# Patient Record
Sex: Female | Born: 1975 | Race: White | Hispanic: Yes | Marital: Single | Smoking: Never smoker
Health system: Southern US, Community
[De-identification: ages and names within clinical notes are randomized; demographics above are authoritative.]

## PROBLEM LIST (undated history)

## (undated) DIAGNOSIS — I8390 Asymptomatic varicose veins of unspecified lower extremity: Secondary | ICD-10-CM

## (undated) HISTORY — PX: CHOLECYSTECTOMY: SHX55

---

## 2005-03-03 ENCOUNTER — Observation Stay: Payer: Self-pay

## 2007-10-15 ENCOUNTER — Ambulatory Visit: Payer: Self-pay | Admitting: Family Medicine

## 2012-07-09 ENCOUNTER — Ambulatory Visit: Payer: Self-pay | Admitting: Primary Care

## 2014-02-21 ENCOUNTER — Emergency Department: Payer: Self-pay | Admitting: Emergency Medicine

## 2014-02-21 LAB — URINALYSIS, COMPLETE
BILIRUBIN, UR: NEGATIVE
Bacteria: NONE SEEN
GLUCOSE, UR: NEGATIVE mg/dL (ref 0–75)
KETONE: NEGATIVE
Leukocyte Esterase: NEGATIVE
Nitrite: NEGATIVE
Ph: 6 (ref 4.5–8.0)
Protein: NEGATIVE
RBC,UR: 3 /HPF (ref 0–5)
Specific Gravity: 1.012 (ref 1.003–1.030)
WBC UR: 1 /HPF (ref 0–5)

## 2014-02-21 LAB — CBC WITH DIFFERENTIAL/PLATELET
BASOS ABS: 0.1 10*3/uL (ref 0.0–0.1)
BASOS PCT: 0.7 %
Eosinophil #: 0.2 10*3/uL (ref 0.0–0.7)
Eosinophil %: 2 %
HCT: 37.7 % (ref 35.0–47.0)
HGB: 12.9 g/dL (ref 12.0–16.0)
LYMPHS ABS: 4.4 10*3/uL — AB (ref 1.0–3.6)
Lymphocyte %: 35.2 %
MCH: 32.5 pg (ref 26.0–34.0)
MCHC: 34.1 g/dL (ref 32.0–36.0)
MCV: 95 fL (ref 80–100)
MONOS PCT: 5.8 %
Monocyte #: 0.7 x10 3/mm (ref 0.2–0.9)
NEUTROS ABS: 7 10*3/uL — AB (ref 1.4–6.5)
NEUTROS PCT: 56.3 %
Platelet: 266 10*3/uL (ref 150–440)
RBC: 3.97 10*6/uL (ref 3.80–5.20)
RDW: 12.8 % (ref 11.5–14.5)
WBC: 12.5 10*3/uL — ABNORMAL HIGH (ref 3.6–11.0)

## 2014-02-21 LAB — COMPREHENSIVE METABOLIC PANEL
ALBUMIN: 4.6 g/dL (ref 3.4–5.0)
ALK PHOS: 83 U/L
AST: 35 U/L (ref 15–37)
Anion Gap: 5 — ABNORMAL LOW (ref 7–16)
BILIRUBIN TOTAL: 0.2 mg/dL (ref 0.2–1.0)
BUN: 16 mg/dL (ref 7–18)
CHLORIDE: 107 mmol/L (ref 98–107)
CO2: 26 mmol/L (ref 21–32)
CREATININE: 0.64 mg/dL (ref 0.60–1.30)
Calcium, Total: 8.9 mg/dL (ref 8.5–10.1)
EGFR (Non-African Amer.): >60
GLUCOSE: 89 mg/dL (ref 65–99)
Osmolality: 276 (ref 275–301)
POTASSIUM: 3.4 mmol/L — AB (ref 3.5–5.1)
SGPT (ALT): 40 U/L (ref 12–78)
Sodium: 138 mmol/L (ref 136–145)
Total Protein: 8.3 g/dL — ABNORMAL HIGH (ref 6.4–8.2)

## 2014-02-22 LAB — GC/CHLAMYDIA PROBE AMP

## 2014-02-22 LAB — WET PREP, GENITAL

## 2015-04-30 ENCOUNTER — Encounter: Payer: Self-pay | Admitting: Emergency Medicine

## 2015-04-30 ENCOUNTER — Emergency Department: Payer: Self-pay

## 2015-04-30 DIAGNOSIS — R51 Headache: Secondary | ICD-10-CM | POA: Insufficient documentation

## 2015-04-30 NOTE — ED Notes (Signed)
Pt presents to ER alert and amb. Pt states left side of head pain. Pt reports intermittent pain on left side. No facial droop, clear speech. Ambulatory with steady gait.

## 2015-05-01 ENCOUNTER — Emergency Department
Admission: EM | Admit: 2015-05-01 | Discharge: 2015-05-01 | Disposition: A | Discharge status: Home / Self Care | Payer: Self-pay | Attending: Emergency Medicine | Admitting: Emergency Medicine

## 2015-05-01 DIAGNOSIS — R519 Headache, unspecified: Secondary | ICD-10-CM

## 2015-05-01 DIAGNOSIS — R51 Headache: Secondary | ICD-10-CM

## 2015-05-01 HISTORY — DX: Asymptomatic varicose veins of unspecified lower extremity: I83.90

## 2015-05-01 LAB — SEDIMENTATION RATE: Sed Rate: 16 mm/hr (ref 0–20)

## 2015-05-01 MED ORDER — KETOROLAC TROMETHAMINE 30 MG/ML IJ SOLN
30.0000 mg | Freq: Once | INTRAMUSCULAR | Status: AC
  Administered 2015-05-01: 30 mg via INTRAVENOUS
  Filled 2015-05-01: qty 1

## 2015-05-01 MED ORDER — METOCLOPRAMIDE HCL 5 MG/ML IJ SOLN
10.0000 mg | Freq: Once | INTRAMUSCULAR | Status: AC
  Administered 2015-05-01: 10 mg via INTRAVENOUS
  Filled 2015-05-01: qty 2

## 2015-05-01 NOTE — ED Notes (Signed)
Pt states pain is better 

## 2015-05-01 NOTE — ED Notes (Signed)
Pt has left side temporal headache.  No n/v.  Sx for 2 days.  Pt alert and speech is clear. States pain goes into left side of face.  Intermittent dizziness.

## 2015-05-01 NOTE — Discharge Instructions (Signed)
Dolor de cabeza general sin causa  (General Headache Without Cause)   EL dolor de cabeza es un dolor o malestar que se siente en la zona de la cabeza o del cuello. Puede no tener una causa especfica. Hay muchas causas y tipos de dolores de [place]. Los ms comunes son:   Cefalea tensional.  Cefaleas migraosas.  Cefalea en brotes.  Cefaleas diarias crnicas. INSTRUCCIONES PARA EL CUIDADO EN EL HOGAR   [place]umpla con todas las citas programadas con su mdico o con el especialista al que lo hayan derivado.  Slo tome medicamentos de venta libre o recetados para Glass blower/designer o Health and safety inspector, segn las indicaciones de su mdico.  Cuando sienta dolor de cabeza acustese en un cuarto oscuro y tranquilo.  Lleve un registro diario para Neurosurgeon lo que Engineer, mining. Por ejemplo, escriba:  Lo que come y bebe.  Cunto tiempo duerme.  Todo cambio en la dieta o medicamentos.  Intente con masajes u otras tcnicas de relajacin.  Colquese compresas de hielo o calor en la cabeza y en el cuello. selos 3 a 4 veces por da de 15 a 20 minutos por vez, o como sea necesario.  Limite las situaciones de estrs.  Sintese con la espalda recta y no  tense los msculos.  Si fuma, deje de hacerlo.  Limite el consumo de bebidas alcohlicas.  Consuma menos cantidad de cafena o deje de tomarla.  Coma y duerma en horarios regulares.  Duerma entre 7 y 78 horas o como lo indique su [place] luces tenues si le molestan las luces brillantes y Actor dolor de [place]. SOLICITE ATENCIN MDICA SI:   Tiene problemas con los Haematologist.  Los medicamentos no Teaching laboratory technician.  El dolor de cabeza que senta habitualmente es diferente.  Tiene nuseas o vmitos. SOLICITE ATENCIN MDICA DE INMEDIATO SI:   El dolor se hace cada vez ms intenso.  Tiene fiebre.  Presenta rigidez en el cuello.  Sufre prdida de la visin.  Presenta debilidad muscular o prdida  del control muscular.  Comienza a perder el equilibrio o tiene problemas para caminar.  Sufre mareos o se desmaya.  Tiene sntomas graves que son diferentes a los primeros sntomas. ASEGRESE DE QUE:   Comprende estas instrucciones.  Controlar su enfermedad.  Solicitar ayuda de inmediato si no mejora o empeora. Document Released: 06/29/2005 Document Revised: 03/20/2012 [facility] Patient Information 2015 [place]. This information is not intended to replace advice given to you by your health care provider. Make sure you discuss any questions you have with your health care provider.

## 2015-05-01 NOTE — ED Provider Notes (Signed)
[facility] Emergency Department Provider Note  ____________________________________________  Time seen: 1:15 AM  I have reviewed the triage vital signs and the nursing notes.   HISTORY  Chief Complaint Facial Pain     HPI Katelyn Oneal is a 38 y.o. female presents with left temporal headache that is currently 9 out of 10 on a pain score 2 days. Patient states that the pain is common in by intermittent dizziness. Patient denies any weakness or numbness, no visual changes no gait instability. Patient denies taking any analgesic at home. Patient denies any previous headache of this nature in the past. No history of migraines. Patient denies any fever no neck pain or stiffness.    Past Medical History  Diagnosis Date  . Varicose vein     There are no active problems to display for this patient.   Past Surgical History  Procedure Laterality Date  . Cholecystectomy      No current outpatient prescriptions on file.  Allergies Review of patient's allergies indicates no known allergies.  History reviewed. No pertinent family history.  Social History History  Substance Use Topics  . Smoking status: Never Smoker   . Smokeless tobacco: Not on file  . Alcohol Use: No    Review of Systems  Constitutional: Negative for fever. Eyes: Negative for visual changes. ENT: Negative for sore throat. Cardiovascular: Negative for chest pain. Respiratory: Negative for shortness of breath. Gastrointestinal: Negative for abdominal pain, vomiting and diarrhea. Genitourinary: Negative for dysuria. Musculoskeletal: Negative for back pain. Skin: Negative for rash. Neurological: Positive for headaches   10-point ROS otherwise negative.  ____________________________________________   PHYSICAL EXAM:  VITAL SIGNS: ED Triage Vitals  Enc Vitals Group     BP 04/30/15 2251 135/69 mmHg     Pulse Rate 04/30/15 2251 62     Resp 04/30/15 2251 20     Temp  04/30/15 2251 98.3 F (36.8 C)     Temp Source 04/30/15 2251 Oral     SpO2 04/30/15 2251 100 %     Weight 04/30/15 2251 196 lb (88.905 kg)     Height 04/30/15 2251 5\' 4"  (1.626 m)     Head Cir --      Peak Flow --      Pain Score 04/30/15 2251 10     Pain Loc --      Pain Edu? --      Excl. in [place]? --     Constitutional: Alert and oriented. Well appearing and in no distress. Eyes: Conjunctivae are normal. PERRL. Normal extraocular movements. ENT   Head: Normocephalic and atraumatic.   Nose: No congestion/rhinnorhea.   Mouth/Throat: Mucous membranes are moist.   Neck: No stridor. Cardiovascular: Normal rate, regular rhythm. Normal and symmetric distal pulses are present in all extremities. No murmurs, rubs, or gallops. Respiratory: Normal respiratory effort without tachypnea nor retractions. Breath sounds are clear and equal bilaterally. No wheezes/rales/rhonchi. Gastrointestinal: Soft and nontender. No distention. There is no CVA tenderness. Genitourinary: deferred Musculoskeletal: Nontender with normal range of motion in all extremities. No joint effusions.  No lower extremity tenderness nor edema. Neurologic:  Normal speech and language. No gross focal neurologic deficits are appreciated. Speech is normal.  Skin:  Skin is warm, dry and intact. No rash noted. Psychiatric: Mood and affect are normal. Speech and behavior are normal. Patient exhibits appropriate insight and judgment.      RADIOLOGY  CT scan of the head revealed no acute intracranial abnormality  CT Head Wo Contrast (Final result) Result time: 05/01/15 00:03:46   Final result by Rad Results In Interface (05/01/15 00:03:46)   Narrative:   CLINICAL DATA: Left-sided headache.  EXAM: CT HEAD WITHOUT CONTRAST  TECHNIQUE: Contiguous axial images were obtained from the base of the skull through the vertex without intravenous contrast.  COMPARISON: None.  FINDINGS: No intracranial  hemorrhage, mass effect, or midline shift. No hydrocephalus. The basilar cisterns are patent. No evidence of territorial infarct. No intracranial fluid collection. Calvarium is intact. Included paranasal sinuses and mastoid air cells are well aerated.  IMPRESSION: No acute intracranial abnormality.   Electronically Signed By: Jeb Levering M.D. On: 05/01/2015 00:03       INITIAL IMPRESSION / ASSESSMENT AND PLAN / ED COURSE  Pertinent labs & imaging results that were available during my care of the patient were reviewed by me and considered in my medical decision making (see chart for details).  ----------------------------------------- 1:48 AM on 05/01/2015 -----------------------------------------  Patient received Toradol and Reglan with pain score decreased to 2 out of 10. Dizziness at this time considered other etiologies of the patient's pain mainly meningitis or intracranial mass or hemorrhage given absence of fever or neck pain or stiffness and negative CT scan unlikely to be the etiology of patient's pain. ____________________________________________   FINAL CLINICAL IMPRESSION(S) / ED DIAGNOSES  Final diagnoses:  Acute nonintractable headache, unspecified headache type      Gregor Hams, MD 05/01/15 [phone]

## 2016-08-01 ENCOUNTER — Ambulatory Visit (INDEPENDENT_AMBULATORY_CARE_PROVIDER_SITE_OTHER): Payer: Self-pay | Admitting: Podiatry

## 2016-08-01 ENCOUNTER — Ambulatory Visit (INDEPENDENT_AMBULATORY_CARE_PROVIDER_SITE_OTHER): Payer: Self-pay

## 2016-08-01 VITALS — BP 102/67 | HR 70 | Temp 98.2°F | Resp 16 | Ht 64.0 in | Wt 200.0 lb

## 2016-08-01 DIAGNOSIS — M722 Plantar fascial fibromatosis: Secondary | ICD-10-CM

## 2016-08-01 DIAGNOSIS — R52 Pain, unspecified: Secondary | ICD-10-CM

## 2016-08-01 MED ORDER — MELOXICAM 15 MG PO TABS: 15.0000 mg | ORAL_TABLET | Freq: Every day | ORAL | 3 refills | Status: DC

## 2016-08-01 MED ORDER — METHYLPREDNISOLONE 4 MG PO TBPK: ORAL_TABLET | ORAL | 0 refills | Status: DC

## 2016-08-01 NOTE — Progress Notes (Signed)
   Subjective:    Patient ID: Katelyn Oneal, female    DOB: 1975/12/25, 40 y.o.   MRN: [id number]  HPI: She presents today with a chief complaint of pain to her plantar medial aspect of her right foot. She states has been going on for about 5-6 months now this started while she was pregnant with her youngest son. She states that she's tried icing it which gives him some relief. She states that she is not breast-feeding.  Review of Systems  Musculoskeletal: Positive for gait problem.  All other systems reviewed and are negative.      Objective:   Physical Exam: Vital signs are stable alert and oriented 3. Pulses are palpable. Neurologic sensorium is intact. Deep tendon reflexes are intact. Strength was 5 over 5 dorsiflexion plantar flexors and inverters everters all intrinsic musculature is intact. Orthopedic evaluation of her tall joints distal to the ankle range of motion without crepitation. She has pain on palpation medially a neutral of the right heel. Radiographs taken today do demonstrate soft tissue increase in density of the plantar fascia calcaneal insertion site of the right heel. Cutaneous evaluationof well-hydrated cutis no erythema edema cellulitis drainage or odor.        Assessment & Plan:  Assessment: Plantar fasciitis right.  Plan: I injected her right heel today with Kenalog and local anesthetic started her on a Medrol Dosepak to be followed by meloxicam. Placed her in a plantar fascia brace. Discussed appropriate shoe gear stretching exercises ice therapy and shoe modifications. Will follow up with her in 1 month. Dispensed stretching exercises.

## 2016-08-01 NOTE — Patient Instructions (Signed)
Fascitis plantar con rehabilitación  (Plantar Fasciitis With Rehab)  La fascia plantar es una estructura fibrosa, tipo ligamento, de tejido blando que abarca la parte inferior del pie. La fascitis plantar es una enfermedad que ocasiona dolor en el pie debido a la inflamación del tejido.  SÍNTOMAS  · Dolor y sensibilidad en la planta del pie.  · Dolor especialmente al ponerse de pie o caminar.  CAUSAS  La fascitis plantar está causada por irritación y lesión en la fascia plantar debajo del pie. Los mecanismos más frecuentes de una lesión son:  · Golpe directo en la planta del pie.  · Daño a un pequeño nervio que atraviesa el pie, en el lugar en que la fascia principal se une al hueso del talón.  · Estrés aplicado en la fascia plantar debido a espolones óseos.  EL RIESGO AUMENTA CON:   · Actividades que estresan la fascia plantar (correr, saltar, pivotar o cortar).  · Poca fuerza y flexibilidad.  · Calzado mal ajustado.  · Músculos de la pantorrilla tensos.  · Pie plano.  · No hacer un precalentamiento adecuado.  · Obesidad.  PREVENCIÓN  · Precalentamiento adecuado y elongación antes de la actividad.  · Descanso y recuperación entre actividades.  · Mantener la forma física:    Fuerza, flexibilidad y resistencia muscular.    Capacidad cardiovascular.  · Mantenga un peso corporal adecuado.  · Evite el estrés en la fascia plantar.  · Para deportistas con pie plano, utilización de plantillas anatómicas para los arcos.  PRONÓSTICO  Si se trata adecuadamente, generalmente es curable sin cirugía. En ocasiones requiere someterse a una cirugía.  POSIBLES COMPLICACIONES  · La recurrencia frecuente de los síntomas puede dar como resultado un problema crónico.  · Problemas en la cintura causados para compensar la lesión, como renguera.  · Dolor o debilidad en el pie al adelantar el pie luego de la cirugía.  · Inflamación crónica, cicatrización y ruptura parcial o completa de la fascia, que se produce luego de repetidas  inyecciones.  TRATAMIENTO  El tratamiento inicial incluye el uso de medicamentos y la aplicación de hielo para reducir el dolor y la inflamación. Los ejercicios de elongación y fortalecimiento pueden ayudar a reducir el dolor con la actividad, en especial los dirigidos al tendón de [place]. Los ejercicios pueden realizarse en el hogar o con un terapeuta. El médico le podrá recomendar que utilice tacos o soportes para el arco para ayudar a reducir el estrés de la fascia plantar. En algunos casos se indica una inyección de corticoides para reducir la inflamación. Si los síntomas persisten por más de 6 meses de tratamiento no quirúrgico (conservador), se indicará la cirugía.   MEDICAMENTOS  · Si necesita analgésicos, se recomiendan los antiinflamatorios no esteroides, como aspirina e ibuprofeno y otros calmantes menores, como acetaminofeno  · No tome medicamentos para el dolor dentro de los 7 días previos a la cirugía.  · Los analgésicos prescriptos se indicarán si el médico lo considera necesario. Utilícelos como se le indique y sólo cuando lo necesite.  · En algunos casos se indica una inyección de corticosteroides. Estas inyecciones deben reservarse para los casos graves, porque sólo se pueden administrar una determinada cantidad de veces.  CALOR Y FRÍO  · El tratamiento con frío alivia el dolor y reduce la inflamación. El frío debe aplicarse durante 10 a 15 minutos cada 2 ó 3 horas para reducir la inflamación y el dolor e inmediatamente después de cualquier actividad que agrava los síntomas.   Utilice bolsas de hielo o masajee la zona con un trozo de hielo (masaje de hielo).  · El calor puede usarse antes de elongar y de las actividades de fortalecimiento indicadas por el profesional, le fisioterapeuta o el entrenador. Utilice una bolsa térmica o sumerja la lesión en agua caliente.  SOLICITE ATENCIÓN MÉDICA DE INMEDIATO SI:  · El tratamiento no lo beneficia, o el trastorno empeora.  · Los medicamentos producen  efectos secundarios.  EJERCICIOS  EJERCICIOS DE AMPLITUD DE MOVIMIENTOS Y ELONGACIÓN - Fascitis plantar (síndrome del espolón en el talón)  Estos ejercicios le ayudarán en la recuperación de la lesión. Los síntomas podrán aliviarse con o sin asistencia adicional de su médico, fisioterapeuta o entrenador. Al completar estos ejercicios, recuerde:   · Restaurar la flexibilidad del tejido ayuda a que las articulaciones recuperen el movimiento normal. Esto permite que el movimiento y la actividad sea más saludables y menos dolorosos.  · Para que sea efectiva, cada elongación debe realizarse durante al menos 30 segundos.  · La elongación nunca debe ser dolorosa. Deberá sentir sólo un alargamiento o distensión suave del tejido que estira.  AMPLITUD DE MOVIMIENTOS - Extensión de los dedos - flexión  · Siéntese con la pierna derecha / izquierdo cruzada sobre la rodilla opuesta.  · Tome los dedos de los pies y empújelos hacia usted. Debe sentir un estiramiento suave en la zona inferior de los dedos y del pie.  · Mantenga esta posición durante __________ segundos.  · Luego, tome los dedos de los pies y empújelos hacia abajo. Debe sentir un estiramiento suave en la zona superior de los dedos y del pie.  · Mantenga esta posición durante __________ segundos.  Repítalo __________ veces. Realice este estiramiento __________ veces por día.   AMPLITUD DE MOVIMIENTOS - Dorsiflexión del tobillo - activa, asistida  · Quítese los zapatos y siéntese en una silla, preferiblemente en una superficie sin alfombra.  · Coloque el pie derecha / izquierdo debajo de la rodilla. Extienda la pierna contraria para estar apoyado.  · Con el talón hacia abajo, deslice el pie derecha / izquierdo hacia la silla hasta que sienta un estiramiento en el tobillo o pantorrilla. Si no lo siente, deslice la cadera hacia adelante hasta el borde de la silla, manteniendo el talón hacia abajo.  · Mantenga esta posición durante __________ segundos.  Repítalo  __________ veces. Realice este estiramiento __________ veces por día.   ELONGACIÓN - Gastroc De pie  · Coloque las manos en la pared.  · Extienda la pierna derecha / izquierdo y mantenga la rodilla levemente flexionada.  · Apunte los dedos ligeramente hacia adentro con el pie de atrás.  · Mantenga el talón derecha / izquierdo en el suelo y la rodilla recta, cambie el peso hacia la pared y no permita que la espalda se arquee.  · Debe sentir un estiramiento en la pantorrila derecha / izquierdo. Mantenga esta posicición durante __________ segundos.  Repítalo __________ veces. Realice este estiramiento __________ veces por día.  ELONGACIÓN - Sóleo De pie  · Coloque las manos en la pared.  · Extienda la pierna derecha / izquierdo y mantenga la rodilla levemente flexionada.  · Apunte los dedos ligeramente hacia adentro con el pie de atrás.  · Mantenga el talón derecha / izquierdo en el suelo, doble la rodilla de atrás y cambie suavemente el peso sobre la pierna de atrás hasta que sienta un ligero estiramiento en la pantorrilla de atrás.  · Mantenga esta posicición durante __________ segundos.    Repítalo __________ veces. Realice este estiramiento __________ veces por día.  ELONGACIÓN - Gastrocsoleus De pie  Nota: Este ejercicio puede ser muy estresante para el pie y el tobillo. Realice los ejercicios sólo en la forma indicada por el profesional que lo asiste.   · Coloque la región metatarsiana del pie derecha / izquierdo y el otro en el mismo escalón.  · Si es necesario, sosténgase de la pared o de la barandilla de una escalera para mantener el equilibrio.  · Levante lentamente el otro pie, y permita que el peso del cuerpo presione el talón sobre el borde del escalón.  · Debe sentir un estiramiento en la pantorrilla derecha / izquierdo.  · Mantenga esta posicición durante __________ segundos.  · Repita este ejercicio con la rodilla derecha / izquierdo levemente flexionada.  Repítalo __________ veces. Realice este  estiramiento __________ veces por día.   EJERCICIOS DE FORTALECIMIENTO - Fascitis plantar (síndrome del espolón en el talón)  Estos ejercicios le ayudarán en la recuperación de la lesión. Los síntomas podrán desaparecer con o sin mayor intervención del profesional, el fisioterapeuta o el entrenador. Al completar estos ejercicios, recuerde:   · Los músculos pueden ganar tanto la resistencia como la fortaleza que necesita para sus actividades diarias a través de ejercicios controlados.  · Realice los ejercicios como se lo indicó el médico, el fisioterapeuta o el entrenador. Aumente la resistencia y las repeticiones según se le haya indicado.  FUERZA - Rollo de toalla  · Siéntese en una silla en una superficie no alfombrada.  · Coloque el pie en una toalla, y mantenga el talón en el suelo.  · Coloque la toalla alrededor del talón pero envuelva sólo los dedos del pie. Mantenga el talón contra el piso.  · Añada ____________________ al extremo de la toalla si el médico, fisioterapeuta o entrenador se lo indican.  Repítalo __________ veces. Realice este ejercicio __________ veces por día.  FUERZA - Inversión del tobillo  · Asegure un extremo de una banda de goma para ejercicios a un objeto fijo (mesa, columna). Ate el extremo opuesto a su pie, justo antes de los dedos.  · Coloque los puños entre las rodillas. Esto hará que la fuerza se concentre en el tobillo.  · Lentamente, tire del dedo gordo hacia arriba y hacia adentro y asegúrese de que la banda está posicionada de tal forma que puede resistir el movimiento completo.  · Mantenga esta posicición durante __________ segundos.  · Haga que los músculos resistan la banda mientras tira lentamente el pie hacia atrás hasta la posición inicial.  Repítalo __________ veces. Realice este ejercicio __________ veces por día.      Esta información no tiene como fin reemplazar el consejo del médico. Asegúrese de hacerle al médico cualquier pregunta que tenga.     Document Released:  07/06/2006 Document Revised: 02/03/2015  [employer] Interactive Patient Education ©2016 [employer]

## 2016-08-29 ENCOUNTER — Ambulatory Visit: Payer: Medicaid Other | Admitting: Podiatry

## 2016-09-05 ENCOUNTER — Ambulatory Visit: Payer: Self-pay | Admitting: Podiatry

## 2017-12-20 ENCOUNTER — Emergency Department: Payer: Self-pay

## 2017-12-20 ENCOUNTER — Other Ambulatory Visit: Payer: Self-pay

## 2017-12-20 ENCOUNTER — Emergency Department
Admission: EM | Admit: 2017-12-20 | Discharge: 2017-12-20 | Disposition: A | Discharge status: Home / Self Care | Payer: Self-pay | Attending: Student in an Organized Health Care Education/Training Program | Admitting: Student in an Organized Health Care Education/Training Program

## 2017-12-20 DIAGNOSIS — R300 Dysuria: Secondary | ICD-10-CM | POA: Insufficient documentation

## 2017-12-20 DIAGNOSIS — Z79899 Other long term (current) drug therapy: Secondary | ICD-10-CM | POA: Insufficient documentation

## 2017-12-20 DIAGNOSIS — R102 Pelvic and perineal pain: Secondary | ICD-10-CM | POA: Insufficient documentation

## 2017-12-20 LAB — URINALYSIS, COMPLETE (UACMP) WITH MICROSCOPIC
Bilirubin Urine: NEGATIVE
GLUCOSE, UA: NEGATIVE mg/dL
Ketones, ur: NEGATIVE mg/dL
Nitrite: NEGATIVE
PH: 6 (ref 5.0–8.0)
Protein, ur: NEGATIVE mg/dL
SPECIFIC GRAVITY, URINE: 1.005 (ref 1.005–1.030)

## 2017-12-20 LAB — LIPASE, BLOOD: Lipase: 31 U/L (ref 11–51)

## 2017-12-20 LAB — COMPREHENSIVE METABOLIC PANEL
ALT: 37 U/L (ref 14–54)
AST: 27 U/L (ref 15–41)
Albumin: 4.7 g/dL (ref 3.5–5.0)
Alkaline Phosphatase: 77 U/L (ref 38–126)
Anion gap: 9 (ref 5–15)
BUN: 15 mg/dL (ref 6–20)
CHLORIDE: 104 mmol/L (ref 101–111)
CO2: 25 mmol/L (ref 22–32)
CREATININE: 0.66 mg/dL (ref 0.44–1.00)
Calcium: 9.2 mg/dL (ref 8.9–10.3)
GFR calc non Af Amer: >60 mL/min (ref >60)
Glucose, Bld: 87 mg/dL (ref 65–99)
POTASSIUM: 3.4 mmol/L — AB (ref 3.5–5.1)
SODIUM: 138 mmol/L (ref 135–145)
Total Bilirubin: 0.5 mg/dL (ref 0.3–1.2)
Total Protein: 8.6 g/dL — ABNORMAL HIGH (ref 6.5–8.1)

## 2017-12-20 LAB — CBC
HCT: 41.3 % (ref 35.0–47.0)
HEMOGLOBIN: 13.7 g/dL (ref 12.0–16.0)
MCH: 31.2 pg (ref 26.0–34.0)
MCHC: 33 g/dL (ref 32.0–36.0)
MCV: 94.5 fL (ref 80.0–100.0)
Platelets: 311 10*3/uL (ref 150–440)
RBC: 4.37 MIL/uL (ref 3.80–5.20)
RDW: 13.4 % (ref 11.5–14.5)
WBC: 11.7 10*3/uL — ABNORMAL HIGH (ref 3.6–11.0)

## 2017-12-20 LAB — POCT PREGNANCY, URINE: Preg Test, Ur: NEGATIVE

## 2017-12-20 MED ORDER — NITROFURANTOIN MONOHYD MACRO 100 MG PO CAPS: 100.0000 mg | ORAL_CAPSULE | Freq: Two times a day (BID) | ORAL | 0 refills | Status: AC

## 2017-12-20 MED ORDER — TRAMADOL HCL 50 MG PO TABS
50.0000 mg | ORAL_TABLET | Freq: Once | ORAL | Status: AC
  Administered 2017-12-20: 50 mg via ORAL

## 2017-12-20 MED ORDER — TRAMADOL HCL 50 MG PO TABS: 50.0000 mg | ORAL_TABLET | Freq: Four times a day (QID) | ORAL | 0 refills | Status: AC | PRN

## 2017-12-20 MED ORDER — TRAMADOL HCL 50 MG PO TABS
ORAL_TABLET | ORAL | Status: AC
  Filled 2017-12-20: qty 1

## 2017-12-20 NOTE — ED Provider Notes (Signed)
[facility] Emergency Department Provider Note    None    (approximate)  I have reviewed the triage vital signs and the nursing notes.   HISTORY  Chief Complaint Abdominal Pain    HPI Katelyn Oneal is a 42 y.o. female was no significant past medical history presents with chief complaint of left sided lower abdominal pain initially started on Monday with foul-smelling urine, darker urine and dysuria.  The symptoms somewhat improved but is still having left lower quadrant primarily pelvic pain.  Denies any known history of cysts or fibroids.  States the pain comes and goes and intermittently severe in nature.  Denies any fevers.  No diarrhea or constipation.  No blood in her stools.  States that she did have some slight vaginal spotting yesterday but none today.  Past Medical History:  Diagnosis Date  . Varicose vein    No family history on file. Past Surgical History:  Procedure Laterality Date  . CHOLECYSTECTOMY     There are no active problems to display for this patient.     Prior to Admission medications   Medication Sig Start Date End Date Taking? Authorizing Provider  diclofenac (VOLTAREN) 75 MG EC tablet Take 75 mg by mouth 2 (two) times daily.    [provider]  meloxicam (MOBIC) 15 MG tablet Take 1 tablet (15 mg total) by mouth daily. 08/01/16   Hyatt, Max T, DPM  methylPREDNISolone (MEDROL DOSEPAK) 4 MG TBPK tablet 6 day dose pack - take as directed 08/01/16   Hyatt, Max T, DPM  nitrofurantoin, macrocrystal-monohydrate, (MACROBID) 100 MG capsule Take 1 capsule (100 mg total) by mouth 2 (two) times daily for 3 days. 12/20/17 12/23/17  Merlyn Lot, MD  traMADol (ULTRAM) 50 MG tablet Take 1 tablet (50 mg total) by mouth every 6 (six) hours as needed. 12/20/17 12/20/18  Merlyn Lot, MD    Allergies Patient has no known allergies.    Social History Social History   Tobacco Use  . Smoking status: Never Smoker    Substance Use Topics  . Alcohol use: No  . Drug use: Not on file    Review of Systems Patient denies headaches, rhinorrhea, blurry vision, numbness, shortness of breath, chest pain, edema, cough, abdominal pain, nausea, vomiting, diarrhea, dysuria, fevers, rashes or hallucinations unless otherwise stated above in HPI. ____________________________________________   PHYSICAL EXAM:  VITAL SIGNS: Vitals:   12/20/17 1638 12/20/17 2025  BP: (!) 143/75 121/85  Pulse: 72 72  Resp: 18 17  Temp: 98.7 F (37.1 C)   SpO2: 99% 100%    Constitutional: Alert and oriented. Well appearing and in no acute distress. Eyes: Conjunctivae are normal.  Head: Atraumatic. Nose: No congestion/rhinnorhea. Mouth/Throat: Mucous membranes are moist.   Neck: No stridor. Painless ROM.  Cardiovascular: Normal rate, regular rhythm. Grossly normal heart sounds.  Good peripheral circulation. Respiratory: Normal respiratory effort.  No retractions. Lungs CTAB. Gastrointestinal: Soft and nontender in all four quadrants. No distention. No abdominal bruits. No CVA tenderness. Genitourinary:  Musculoskeletal: No lower extremity tenderness nor edema.  No joint effusions. Neurologic:  Normal speech and language. No gross focal neurologic deficits are appreciated. No facial droop Skin:  Skin is warm, dry and intact. No rash noted. Psychiatric: Mood and affect are normal. Speech and behavior are normal.  ____________________________________________   LABS (all labs ordered are listed, but only abnormal results are displayed)  Results for orders placed or performed during the hospital encounter of 12/20/17 (from the  past 24 hour(s))  Lipase, blood     Status: None   Collection Time: 12/20/17  4:42 PM  Result Value Ref Range   Lipase 31 11 - 51 U/L  Comprehensive metabolic panel     Status: Abnormal   Collection Time: 12/20/17  4:42 PM  Result Value Ref Range   Sodium 138 135 - 145 mmol/L   Potassium 3.4 (L)  3.5 - 5.1 mmol/L   Chloride 104 101 - 111 mmol/L   CO2 25 22 - 32 mmol/L   Glucose, Bld 87 65 - 99 mg/dL   BUN 15 6 - 20 mg/dL   Creatinine, Ser 0.66 0.44 - 1.00 mg/dL   Calcium 9.2 8.9 - 10.3 mg/dL   Total Protein 8.6 (H) 6.5 - 8.1 g/dL   Albumin 4.7 3.5 - 5.0 g/dL   AST 27 15 - 41 U/L   ALT 37 14 - 54 U/L   Alkaline Phosphatase 77 38 - 126 U/L   Total Bilirubin 0.5 0.3 - 1.2 mg/dL   GFR calc non Af Amer >60 >60 mL/min   GFR calc Af Amer >60 >60 mL/min   Anion gap 9 5 - 15  CBC     Status: Abnormal   Collection Time: 12/20/17  4:42 PM  Result Value Ref Range   WBC 11.7 (H) 3.6 - 11.0 K/uL   RBC 4.37 3.80 - 5.20 MIL/uL   Hemoglobin 13.7 12.0 - 16.0 g/dL   HCT 41.3 35.0 - 47.0 %   MCV 94.5 80.0 - 100.0 fL   MCH 31.2 26.0 - 34.0 pg   MCHC 33.0 32.0 - 36.0 g/dL   RDW 13.4 11.5 - 14.5 %   Platelets 311 150 - 440 K/uL  Urinalysis, Complete w Microscopic     Status: Abnormal   Collection Time: 12/20/17  4:42 PM  Result Value Ref Range   Color, Urine STRAW (A) YELLOW   APPearance CLEAR (A) CLEAR   Specific Gravity, Urine 1.005 1.005 - 1.030   pH 6.0 5.0 - 8.0   Glucose, UA NEGATIVE NEGATIVE mg/dL   Hgb urine dipstick MODERATE (A) NEGATIVE   Bilirubin Urine NEGATIVE NEGATIVE   Ketones, ur NEGATIVE NEGATIVE mg/dL   Protein, ur NEGATIVE NEGATIVE mg/dL   Nitrite NEGATIVE NEGATIVE   Leukocytes, UA TRACE (A) NEGATIVE   RBC / HPF 0-5 0 - 5 RBC/hpf   WBC, UA 0-5 0 - 5 WBC/hpf   Bacteria, UA RARE (A) NONE SEEN   Squamous Epithelial / LPF 0-5 (A) NONE SEEN  Pregnancy, urine POC     Status: None   Collection Time: 12/20/17  4:43 PM  Result Value Ref Range   Preg Test, Ur NEGATIVE NEGATIVE   ____________________________________________  EKG____________________________________________  RADIOLOGY  I personally reviewed all radiographic images ordered to evaluate for the above acute complaints and reviewed radiology reports and findings.  These findings were personally discussed  with the patient.  Please see medical record for radiology report.  ____________________________________________   PROCEDURES  Procedure(s) performed:  Procedures    Critical Care performed: no ____________________________________________   INITIAL IMPRESSION / ASSESSMENT AND PLAN / ED COURSE  Pertinent labs & imaging results that were available during my care of the patient were reviewed by me and considered in my medical decision making (see chart for details).  DDX: Enteritis, colitis, cystitis, torsion, cyst, ectopic, colitis, hernia  Katelyn Oneal is a 42 y.o. who presents to the ED with symptoms as described above.  Patient well-appearing.  Able to ambulate and with no unsteady gait.  Patient is not pregnant.  Urinalysis does show trace hemoglobin as well as rare bacteria given her dysuria would have suspicion for UTI but given her left-sided pelvic pain will order ultrasound to exclude torsion or cyst.  Do not feel that CT imaging clinically indicated as the remainder of her abdominal exam is soft and benign.  Possible kidney stone as the patient does not have any flank pain and appears well.  Clinical Course as of Dec 20 2100  Wed Dec 20, 2017  2028 Ultrasound reviewed with patient at bedside with interpreter.  Still having some discomfort but tolerating oral pain medication.  Will give antibiotics for component of cystitis given her dysuria with rare bacteria.  Possible kidney stone but no evidence of AK I or systemic illness.  She is not having any right-sided abdominal pain.  No evidence of hernia.  Possible cyst related pain.  Patient otherwise stable and appropriate for outpatient follow-up.   [PR]    Clinical Course User Index [PR] Merlyn Lot, MD     As part of my medical decision making, I reviewed the following data within the [place] notes reviewed and incorporated, Labs reviewed, notes from prior ED  visits.   ____________________________________________   FINAL CLINICAL IMPRESSION(S) / ED DIAGNOSES  Final diagnoses:  Pelvic pain  Dysuria      NEW MEDICATIONS STARTED DURING THIS VISIT:  Discharge Medication List as of 12/20/2017  8:27 PM    START taking these medications   Details  nitrofurantoin, macrocrystal-monohydrate, (MACROBID) 100 MG capsule Take 1 capsule (100 mg total) by mouth 2 (two) times daily for 3 days., Starting Wed 12/20/2017, Until Sat 12/23/2017, Print    traMADol (ULTRAM) 50 MG tablet Take 1 tablet (50 mg total) by mouth every 6 (six) hours as needed., Starting Wed 12/20/2017, Until Thu 12/20/2018, Print         Note:  This document was prepared using Dragon voice recognition software and may include unintentional dictation errors.    Merlyn Lot, MD 12/20/17 2102

## 2017-12-20 NOTE — Discharge Instructions (Signed)
CLINICAL DATA:  Pelvic pain centered in the left lower quadrant for 1 day.   EXAM: TRANSABDOMINAL AND TRANSVAGINAL ULTRASOUND OF PELVIS   DOPPLER ULTRASOUND OF OVARIES   TECHNIQUE: Both transabdominal and transvaginal ultrasound examinations of the pelvis were performed. Transabdominal technique was performed for global imaging of the pelvis including uterus, ovaries, adnexal regions, and pelvic cul-de-sac.   It was necessary to proceed with endovaginal exam following the transabdominal exam to visualize the adnexa. Color and duplex Doppler ultrasound was utilized to evaluate blood flow to the ovaries.   COMPARISON:  None.   FINDINGS: Uterus   Measurements: 7.2 x 3.8 x 3.8 cm. No fibroids or other mass visualized.   Endometrium   Thickness: 0.7 cm. No focal abnormality visualized. IUD is in place.   Right ovary   Measurements: 5.5 x 3.3 x 5.3 cm. A cyst measuring 4.9 x 2.5 x 5.0 cm is simple in appearance without mural nodule or septation. No debris is seen within the cyst. There is no internal flow within the cyst on Doppler imaging and the cyst has smooth, thin walls.   Left ovary   Measurements: 6.5 x 2.9 x 4.4 cm. A cyst measuring 4.9 x 3.3 x 4.5 cm is simple in appearance without mural nodule or septation. There is no debris within the cyst. There is no internal flow within the cyst on Doppler imaging and the cyst has smooth, thin walls.   Pulsed Doppler evaluation of both ovaries demonstrates normal low-resistance arterial and venous waveforms.   Other findings   No abnormal free fluid.   IMPRESSION: Negative for ovarian torsion.  No acute abnormality.   Simple bilateral ovarian cysts each measure 4.9 cm in diameter. This is almost certainly benign, and no specific imaging follow up is recommended according to the Society of Radiologists in Ultrasound 2010 Consensus Conference Statement (D Clovis Riley et al. Management of Asymptomatic Ovarian and Other  Adnexal Cysts Imaged at [place]: Society of Radiologists in [place] Statement 2010. Radiology 256 (Sept 2010): 916-945.).     Electronically Signed   By: Inge Rise M.D.   On: 12/20/2017 20:03

## 2017-12-20 NOTE — ED Notes (Signed)
Patient transported to X-ray 

## 2017-12-20 NOTE — ED Triage Notes (Signed)
Pt has left lower abd pain with dysuria. States vaginal spotting yesterday.  Pt alert.  Pt denies back pain.

## 2018-01-19 ENCOUNTER — Other Ambulatory Visit: Payer: Self-pay

## 2018-01-19 ENCOUNTER — Emergency Department: Payer: Self-pay

## 2018-01-19 ENCOUNTER — Emergency Department
Admission: EM | Admit: 2018-01-19 | Discharge: 2018-01-19 | Disposition: A | Discharge status: Home / Self Care | Payer: Self-pay | Attending: Emergency Medicine | Admitting: Emergency Medicine

## 2018-01-19 ENCOUNTER — Encounter: Payer: Self-pay | Admitting: Emergency Medicine

## 2018-01-19 DIAGNOSIS — R05 Cough: Secondary | ICD-10-CM | POA: Insufficient documentation

## 2018-01-19 DIAGNOSIS — D1721 Benign lipomatous neoplasm of skin and subcutaneous tissue of right arm: Secondary | ICD-10-CM | POA: Insufficient documentation

## 2018-01-19 DIAGNOSIS — R0789 Other chest pain: Secondary | ICD-10-CM | POA: Insufficient documentation

## 2018-01-19 LAB — TROPONIN I

## 2018-01-19 LAB — CBC
HCT: 37 % (ref 35.0–47.0)
Hemoglobin: 12.9 g/dL (ref 12.0–16.0)
MCH: 32.4 pg (ref 26.0–34.0)
MCHC: 34.8 g/dL (ref 32.0–36.0)
MCV: 93.2 fL (ref 80.0–100.0)
PLATELETS: 303 10*3/uL (ref 150–440)
RBC: 3.97 MIL/uL (ref 3.80–5.20)
RDW: 12.9 % (ref 11.5–14.5)
WBC: 10.3 10*3/uL (ref 3.6–11.0)

## 2018-01-19 LAB — BASIC METABOLIC PANEL
Anion gap: 7 (ref 5–15)
BUN: 12 mg/dL (ref 6–20)
CALCIUM: 9 mg/dL (ref 8.9–10.3)
CO2: 25 mmol/L (ref 22–32)
CREATININE: 0.63 mg/dL (ref 0.44–1.00)
Chloride: 105 mmol/L (ref 101–111)
GFR calc Af Amer: >60 mL/min (ref >60)
GLUCOSE: 114 mg/dL — AB (ref 65–99)
Potassium: 3.4 mmol/L — ABNORMAL LOW (ref 3.5–5.1)
SODIUM: 137 mmol/L (ref 135–145)

## 2018-01-19 MED ORDER — IBUPROFEN 800 MG PO TABS
800.0000 mg | ORAL_TABLET | Freq: Once | ORAL | Status: AC
  Administered 2018-01-19: 800 mg via ORAL

## 2018-01-19 MED ORDER — IBUPROFEN 600 MG PO TABS: 600.0000 mg | ORAL_TABLET | Freq: Three times a day (TID) | ORAL | 0 refills | Status: DC | PRN

## 2018-01-19 MED ORDER — IPRATROPIUM-ALBUTEROL 0.5-2.5 (3) MG/3ML IN SOLN
3.0000 mL | Freq: Once | RESPIRATORY_TRACT | Status: AC
  Administered 2018-01-19: 3 mL via RESPIRATORY_TRACT
  Filled 2018-01-19: qty 3

## 2018-01-19 MED ORDER — ALBUTEROL SULFATE HFA 108 (90 BASE) MCG/ACT IN AERS: 2.0000 | INHALATION_SPRAY | Freq: Four times a day (QID) | RESPIRATORY_TRACT | 0 refills | Status: DC | PRN

## 2018-01-19 MED ORDER — IBUPROFEN 400 MG PO TABS
600.0000 mg | ORAL_TABLET | Freq: Once | ORAL | Status: DC
  Filled 2018-01-19: qty 2

## 2018-01-19 NOTE — ED Notes (Signed)
ED Provider at bedside. 

## 2018-01-19 NOTE — ED Triage Notes (Signed)
C/O left chest pain x 1 month.  States initially pain only bothered patient when she was walking.  States pain has become constant for the last 2-3 days.

## 2018-01-19 NOTE — ED Provider Notes (Signed)
[facility] Emergency Department Provider Note  ____________________________________________   First MD Initiated Contact with Patient 01/19/18 1855     (approximate)  I have reviewed the triage vital signs and the nursing notes.   HISTORY  Chief Complaint Chest Pain    HPI Katelyn Oneal is a 42 y.o. female who comes to the emergency department with 1 month of intermittent atypical left-sided chest pain.  Nonexertional.  No shortness of breath.  Mild cough.  Able to lie flat.  No leg swelling.  Not taking oral contraceptives.  No recent surgery travel or immobilization.  No history of malignancy.  The symptoms have been somewhat worse for the past 2 to 3 days which prompted the visit.  No recent illness.  Her chest pain is sharp throbbing aching left lateral chest nonradiating.  Past Medical History:  Diagnosis Date  . Varicose vein     There are no active problems to display for this patient.   Past Surgical History:  Procedure Laterality Date  . CHOLECYSTECTOMY      Prior to Admission medications   Medication Sig Start Date End Date Taking? Authorizing Provider  albuterol (PROVENTIL HFA;VENTOLIN HFA) 108 (90 Base) MCG/ACT inhaler Inhale 2 puffs into the lungs every 6 (six) hours as needed for wheezing or shortness of breath. 01/19/18   Darel Hong, MD  ibuprofen (ADVIL,MOTRIN) 600 MG tablet Take 1 tablet (600 mg total) by mouth every 8 (eight) hours as needed. 01/19/18   Darel Hong, MD  methylPREDNISolone (MEDROL DOSEPAK) 4 MG TBPK tablet 6 day dose pack - take as directed 08/01/16   Hyatt, Max T, DPM  traMADol (ULTRAM) 50 MG tablet Take 1 tablet (50 mg total) by mouth every 6 (six) hours as needed. 12/20/17 12/20/18  Merlyn Lot, MD    Allergies Patient has no known allergies.  No family history on file.  Social History Social History   Tobacco Use  . Smoking status: Never Smoker  . Smokeless tobacco: Never Used    Substance Use Topics  . Alcohol use: No  . Drug use: Not on file    Review of Systems Constitutional: No fever/chills Eyes: No visual changes. ENT: No sore throat. Cardiovascular: Positive for chest pain. Respiratory: Denies shortness of breath. Gastrointestinal: No abdominal pain.  No nausea, no vomiting.  No diarrhea.  No constipation. Genitourinary: Negative for dysuria. Musculoskeletal: Negative for back pain. Skin: Negative for rash. Neurological: Negative for headaches, focal weakness or numbness.   ____________________________________________   PHYSICAL EXAM:  VITAL SIGNS: ED Triage Vitals [01/19/18 1737]  Enc Vitals Group     BP 132/66     Pulse Rate 80     Resp 16     Temp 97.7 F (36.5 C)     Temp Source Oral     SpO2 98 %     Weight      Height      Head Circumference      Peak Flow      Pain Score      Pain Loc      Pain Edu?      Excl. in [place]?     Constitutional: Alert and oriented x4 pleasant cooperative speaks in complete sentences no diaphoresis Eyes: PERRL EOMI. Head: Atraumatic. Nose: No congestion/rhinnorhea. Mouth/Throat: No trismus Neck: No stridor.   Cardiovascular: Normal rate, regular rhythm. Grossly normal heart sounds.  Good peripheral circulation. Respiratory: Normal respiratory effort.  No retractions. Lungs CTAB and moving good air  Gastrointestinal: Soft nontender Musculoskeletal: No lower extremity edema legs are equal in size neurologic:  Normal speech and language. No gross focal neurologic deficits are appreciated. Skin:  Skin is warm, dry and intact. No rash noted. Psychiatric: Mood and affect are normal. Speech and behavior are normal.    ____________________________________________   DIFFERENTIAL includes but not limited to  Acute coronary syndrome, pericarditis, myocarditis, pulmonary embolism, bronchitis ____________________________________________   LABS (all labs ordered are listed, but only abnormal results  are displayed)  Labs Reviewed  BASIC METABOLIC PANEL - Abnormal; Notable for the following components:      Result Value   Potassium 3.4 (*)    Glucose, Bld 114 (*)    All other components within normal limits  CBC  TROPONIN I    Lab work reviewed by me with no acute disease __________________________________________  EKG  ED ECG REPORT I, Darel Hong, the attending physician, personally viewed and interpreted this ECG.  Date: 01/19/2018 EKG Time:  Rate: 79 Rhythm: normal sinus rhythm QRS Axis: Leftward axis Intervals: normal ST/T Wave abnormalities: normal Narrative Interpretation: no evidence of acute ischemia  ____________________________________________  RADIOLOGY  Chest x-ray reviewed by me concerning for bronchitis ____________________________________________   PROCEDURES  Procedure(s) performed: no  Procedures  Critical Care performed: no  Observation: no ____________________________________________   INITIAL IMPRESSION / ASSESSMENT AND PLAN / ED COURSE  Pertinent labs & imaging results that were available during my care of the patient were reviewed by me and considered in my medical decision making (see chart for details).  The patient comes to the emergency department with 1 month of atypical chest pain.  He come acutely worse for the past several days.  EKG is nonischemic.  Chest x-ray suggestive of chronic bronchitis.  Breathing treatment pending and I will reevaluate.     ----------------------------------------- 8:23 PM on 01/19/2018 -----------------------------------------  After albuterol and ibuprofen the patient feels somewhat improved.  I explained to the patient the diagnostic uncertainty, however I felt she was stable for outpatient management.  She is PERC negative.  When discussing discharge she told me about several lumps in her right upper extremity which are freely mobile and feel like  lipomas. ____________________________________________   FINAL CLINICAL IMPRESSION(S) / ED DIAGNOSES  Final diagnoses:  Atypical chest pain  Lipoma of right upper extremity      NEW MEDICATIONS STARTED DURING THIS VISIT:  Discharge Medication List as of 01/19/2018  8:23 PM    START taking these medications   Details  albuterol (PROVENTIL HFA;VENTOLIN HFA) 108 (90 Base) MCG/ACT inhaler Inhale 2 puffs into the lungs every 6 (six) hours as needed for wheezing or shortness of breath., Starting Fri 01/19/2018, Print    ibuprofen (ADVIL,MOTRIN) 600 MG tablet Take 1 tablet (600 mg total) by mouth every 8 (eight) hours as needed., Starting Fri 01/19/2018, Print         Note:  This document was prepared using Dragon voice recognition software and may include unintentional dictation errors.     Darel Hong, MD 01/22/18 [phone]

## 2018-01-19 NOTE — Discharge Instructions (Signed)
Fortunately today your blood work, EKG, and chest XR were reassuring.  Please follow up with your PMD this coming Monday for a recheck.  Return to the ED sooner for any concerns whatsoever.  It was a pleasure to take care of you today, and thank you for coming to our emergency department.  If you have any questions or concerns before leaving please ask the nurse to grab me and I'm more than happy to go through your aftercare instructions again.  If you were prescribed any opioid pain medication today such as Norco, Vicodin, Percocet, morphine, hydrocodone, or oxycodone please make sure you do not drive when you are taking this medication as it can alter your ability to drive safely.  If you have any concerns once you are home that you are not improving or are in fact getting worse before you can make it to your follow-up appointment, please do not hesitate to call [phone] and come back for further evaluation.  Darel Hong, MD  Results for orders placed or performed during the hospital encounter of 83/15/17  Basic metabolic panel  Result Value Ref Range   Sodium 137 135 - 145 mmol/L   Potassium 3.4 (L) 3.5 - 5.1 mmol/L   Chloride 105 101 - 111 mmol/L   CO2 25 22 - 32 mmol/L   Glucose, Bld 114 (H) 65 - 99 mg/dL   BUN 12 6 - 20 mg/dL   Creatinine, Ser 0.63 0.44 - 1.00 mg/dL   Calcium 9.0 8.9 - 10.3 mg/dL   GFR calc non Af Amer >60 >60 mL/min   GFR calc Af Amer >60 >60 mL/min   Anion gap 7 5 - 15  CBC  Result Value Ref Range   WBC 10.3 3.6 - 11.0 K/uL   RBC 3.97 3.80 - 5.20 MIL/uL   Hemoglobin 12.9 12.0 - 16.0 g/dL   HCT 37.0 35.0 - 47.0 %   MCV 93.2 80.0 - 100.0 fL   MCH 32.4 26.0 - 34.0 pg   MCHC 34.8 32.0 - 36.0 g/dL   RDW 12.9 11.5 - 14.5 %   Platelets 303 150 - 440 K/uL  Troponin I  Result Value Ref Range   Troponin I <0.03 <0.03 ng/mL   Dg Chest 2 View  Result Date: 01/19/2018 CLINICAL DATA:  Left chest pain for 1 month, constant in the past 2-3 days. EXAM: CHEST - 2 VIEW  COMPARISON:  None. FINDINGS: The cardiomediastinal silhouette is within normal limits. The interstitial markings are mildly prominent/thickened diffusely. No confluent airspace opacity, overt pulmonary edema, pleural effusion, or pneumothorax is identified. No acute osseous abnormality is seen. IMPRESSION: Mildly prominent interstitial markings diffusely, possibly reflecting chronic interstitial disease or bronchitis. Electronically Signed   By: Logan Bores M.D.   On: 01/19/2018 18:51

## 2019-05-06 ENCOUNTER — Other Ambulatory Visit: Payer: Self-pay

## 2019-05-06 DIAGNOSIS — Z20822 Contact with and (suspected) exposure to covid-19: Secondary | ICD-10-CM

## 2019-05-08 LAB — NOVEL CORONAVIRUS, NAA: SARS-CoV-2, NAA: DETECTED — AB

## 2019-08-04 ENCOUNTER — Emergency Department
Admission: EM | Admit: 2019-08-04 | Discharge: 2019-08-04 | Disposition: A | Discharge status: Home / Self Care | Payer: Self-pay | Attending: Emergency Medicine | Admitting: Emergency Medicine

## 2019-08-04 ENCOUNTER — Emergency Department: Payer: Self-pay

## 2019-08-04 ENCOUNTER — Encounter: Payer: Self-pay | Admitting: Emergency Medicine

## 2019-08-04 ENCOUNTER — Other Ambulatory Visit: Payer: Self-pay

## 2019-08-04 DIAGNOSIS — R1012 Left upper quadrant pain: Secondary | ICD-10-CM | POA: Insufficient documentation

## 2019-08-04 DIAGNOSIS — R109 Unspecified abdominal pain: Secondary | ICD-10-CM

## 2019-08-04 LAB — HEPATIC FUNCTION PANEL
ALT: 32 U/L (ref 0–44)
AST: 20 U/L (ref 15–41)
Albumin: 4.5 g/dL (ref 3.5–5.0)
Alkaline Phosphatase: 65 U/L (ref 38–126)
Bilirubin, Direct: <0.1 mg/dL (ref 0.0–0.2)
Total Bilirubin: 0.7 mg/dL (ref 0.3–1.2)
Total Protein: 7.5 g/dL (ref 6.5–8.1)

## 2019-08-04 LAB — CBC
HCT: 39.1 % (ref 36.0–46.0)
Hemoglobin: 12.9 g/dL (ref 12.0–15.0)
MCH: 30.8 pg (ref 26.0–34.0)
MCHC: 33 g/dL (ref 30.0–36.0)
MCV: 93.3 fL (ref 80.0–100.0)
Platelets: 326 10*3/uL (ref 150–400)
RBC: 4.19 MIL/uL (ref 3.87–5.11)
RDW: 13.4 % (ref 11.5–15.5)
WBC: 10.7 10*3/uL — ABNORMAL HIGH (ref 4.0–10.5)
nRBC: 0 % (ref 0.0–0.2)

## 2019-08-04 LAB — BASIC METABOLIC PANEL
Anion gap: 9 (ref 5–15)
BUN: 13 mg/dL (ref 6–20)
CO2: 22 mmol/L (ref 22–32)
Calcium: 9.4 mg/dL (ref 8.9–10.3)
Chloride: 106 mmol/L (ref 98–111)
Creatinine, Ser: 0.59 mg/dL (ref 0.44–1.00)
GFR calc Af Amer: >60 mL/min (ref >60)
GFR calc non Af Amer: >60 mL/min (ref >60)
Glucose, Bld: 81 mg/dL (ref 70–99)
Potassium: 4 mmol/L (ref 3.5–5.1)
Sodium: 137 mmol/L (ref 135–145)

## 2019-08-04 LAB — URINALYSIS, COMPLETE (UACMP) WITH MICROSCOPIC
Bacteria, UA: NONE SEEN
Bilirubin Urine: NEGATIVE
Glucose, UA: NEGATIVE mg/dL
Hgb urine dipstick: NEGATIVE
Ketones, ur: NEGATIVE mg/dL
Leukocytes,Ua: NEGATIVE
Nitrite: NEGATIVE
Protein, ur: NEGATIVE mg/dL
Specific Gravity, Urine: 1.003 — ABNORMAL LOW (ref 1.005–1.030)
pH: 5 (ref 5.0–8.0)

## 2019-08-04 LAB — LIPASE, BLOOD: Lipase: 22 U/L (ref 11–51)

## 2019-08-04 LAB — WET PREP, GENITAL
Clue Cells Wet Prep HPF POC: NONE SEEN
Sperm: NONE SEEN
Trich, Wet Prep: NONE SEEN
Yeast Wet Prep HPF POC: NONE SEEN

## 2019-08-04 LAB — PREGNANCY, URINE: Preg Test, Ur: NEGATIVE

## 2019-08-04 MED ORDER — IOHEXOL 300 MG/ML  SOLN
100.0000 mL | Freq: Once | INTRAMUSCULAR | Status: AC | PRN
  Administered 2019-08-04: 100 mL via INTRAVENOUS
  Filled 2019-08-04: qty 100

## 2019-08-04 NOTE — Discharge Instructions (Addendum)
Your work-up was reassuring.  You do have some cyst and fibroids.  Your IUD might be out of place.  Urine to follow-up with Dr. Leonides Schanz next week.  You can take 1 g of Tylenol every 8 hours and ibuprofen 600 every 8 hours.  Return the ER for fevers, worsening pain or any other concerns  Return to the ER for any other concerns.

## 2019-08-04 NOTE — ED Notes (Signed)
First Nurse Note: Pt to ED via POV c/o pain in her left side and back. Pt is in NAD.

## 2019-08-04 NOTE — ED Notes (Signed)
Patient verbalizes understanding of discharge instructions. Patient does not have questions at this time. Patient alert and oriented on departure.

## 2019-08-04 NOTE — ED Notes (Signed)
Pt to u/s

## 2019-08-04 NOTE — Consult Note (Addendum)
Consult History and Physical   SERVICE: Gynecology   Patient Name: Katelyn Oneal Patient MRN:   [id number]  CC: left sided abdominal pain  HPI: Katelyn Oneal is a 43 y.o. P5 who presented to ED with worsening left sided pain.  Location: left lower quadrant abdomen Onset/timing: worsening over the last 2 days Duration: 3 months Quality:  Deep cramping ache, now sharp Severity: at worse 7/10 but typically 2-3/10 Aggravating or alleviating conditions: intercourse makes worse, nothing else makes better Associated signs/symptoms: has had no period since Mirena IUD placed 2 years ago, in the last 3 months has had normal period, no bleeding in between.  This is associated with a cramping pain, no fever chills, or other pain, no change in bowel movements or bladder habits, no radiating pain down legs or around to back.   Context: Katelyn Oneal presented to ED with worsening pain that has been here for at least 2-3 months.  She has the iud but has not had any issues with is during this time.  I was called to see patient because the CT scan report mentioned something about migration into the myometrium, and this was potential source of her pain.      Review of Systems: positives in bold GEN:   fevers, chills, weight changes, appetite changes, fatigue, night sweats HEENT:  HA, vision changes, hearing loss, congestion, rhinorrhea, sinus pressure, dysphagia CV:   CP, palpitations PULM:  SOB, cough GI:  abd pain, N/V/D/C GU:  dysuria, urgency, frequency MSK:  arthralgias, myalgias, back pain, swelling SKIN:  rashes, color changes, pallor NEURO:  numbness, weakness, tingling, seizures, dizziness, tremors PSYCH:  depression, anxiety, behavioral problems, confusion  HEME/LYMPH:  easy bruising or bleeding ENDO:  heat/cold intolerance  Past Obstetrical History: Para 5, ages [age]y   Past Gynecologic History: Patient's last menstrual period was 07/28/2019 (within days).    Past Medical  History: Past Medical History:  Diagnosis Date  . Varicose vein     Past Surgical History:   Past Surgical History:  Procedure Laterality Date  . CHOLECYSTECTOMY      Family History:  family history is not on file. - no gyn cancers  Social History:   reports that she has never smoked. She has never used smokeless tobacco. She reports that she does not drink alcohol.   Home Medications:  Medications reconciled in EPIC  No current facility-administered medications on file prior to encounter.    Current Outpatient Medications on File Prior to Encounter  Medication Sig Dispense Refill  . albuterol (PROVENTIL HFA;VENTOLIN HFA) 108 (90 Base) MCG/ACT inhaler Inhale 2 puffs into the lungs every 6 (six) hours as needed for wheezing or shortness of breath. 1 Inhaler 0  . ibuprofen (ADVIL,MOTRIN) 600 MG tablet Take 1 tablet (600 mg total) by mouth every 8 (eight) hours as needed. 30 tablet 0    Allergies:  No Known Allergies  Physical Exam:  Temp:  [98.6 F (37 C)] 98.6 F (37 C) (11/01 1136) Pulse Rate:  [62] 62 (11/01 1136) Resp:  [16] 16 (11/01 1136) BP: (122)/(60) 122/60 (11/01 1136) SpO2:  [100 %] 100 % (11/01 1136) Weight:  [92.5 kg] 92.5 kg (11/01 1144)   General Appearance:  Well developed, well nourished, no acute distress, alert and oriented, cooperative and appears stated age 89:  Normocephalic atraumatic, extraocular movements intact, moist mucous membranes, neck supple with midline trachea and thyroid without masses Cardiovascular:  Normal S1/S2, regular rate and rhythm, no murmurs, 2+ distal pulses Pulmonary:  clear to auscultation, no wheezes, rales or rhonchi, symmetric air entry, good air exchange Abdomen:  Bowel sounds present, soft, nontender, nondistended, no abnormal masses or organomegaly, no epigastric pain Back: inspection of back is normal Extremities:  extremities normal, no tenderness, atraumatic, no cyanosis or edema Skin:  normal coloration and  turgor, no rashes, no suspicious skin lesions noted  Neurologic:  Cranial nerves 2-12 grossly intact, grossly equal strength and muscle tone, normal speech, no focal findings or movement disorder noted. Psychiatric:  Normal mood and affect, appropriate, no AH/VH Pelvic:  NEFG, no vulvar masses or lesions, normal vaginal mucosa, no vaginal bleeding or discharge, cervix without lesions or erythema, IUD strings visible and palpable.  No pain on right, moderate on left adnexa.  Uterus is mobile and fundus is non-tender.  culdesac filling mass posterior to uterus, which is tender.   Labs/Studies:   Results for orders placed or performed during the hospital encounter of 08/04/19 (from the past 24 hour(s))  Urinalysis, Complete w Microscopic     Status: Abnormal   Collection Time: 08/04/19 11:37 AM  Result Value Ref Range   Color, Urine COLORLESS (A) YELLOW   APPearance CLEAR (A) CLEAR   Specific Gravity, Urine 1.003 (L) 1.005 - 1.030   pH 5.0 5.0 - 8.0   Glucose, UA NEGATIVE NEGATIVE mg/dL   Hgb urine dipstick NEGATIVE NEGATIVE   Bilirubin Urine NEGATIVE NEGATIVE   Ketones, ur NEGATIVE NEGATIVE mg/dL   Protein, ur NEGATIVE NEGATIVE mg/dL   Nitrite NEGATIVE NEGATIVE   Leukocytes,Ua NEGATIVE NEGATIVE   WBC, UA 0-5 0 - 5 WBC/hpf   Bacteria, UA NONE SEEN NONE SEEN   Squamous Epithelial / LPF 0-5 0 - 5  CBC     Status: Abnormal   Collection Time: 08/04/19 11:37 AM  Result Value Ref Range   WBC 10.7 (H) 4.0 - 10.5 K/uL   RBC 4.19 3.87 - 5.11 MIL/uL   Hemoglobin 12.9 12.0 - 15.0 g/dL   HCT 39.1 36.0 - 46.0 %   MCV 93.3 80.0 - 100.0 fL   MCH 30.8 26.0 - 34.0 pg   MCHC 33.0 30.0 - 36.0 g/dL   RDW 13.4 11.5 - 15.5 %   Platelets 326 150 - 400 K/uL   nRBC 0.0 0.0 - 0.2 %  Basic metabolic panel     Status: None   Collection Time: 08/04/19 11:37 AM  Result Value Ref Range   Sodium 137 135 - 145 mmol/L   Potassium 4.0 3.5 - 5.1 mmol/L   Chloride 106 98 - 111 mmol/L   CO2 22 22 - 32 mmol/L    Glucose, Bld 81 70 - 99 mg/dL   BUN 13 6 - 20 mg/dL   Creatinine, Ser 0.59 0.44 - 1.00 mg/dL   Calcium 9.4 8.9 - 10.3 mg/dL   GFR calc non Af Amer >60 >60 mL/min   GFR calc Af Amer >60 >60 mL/min   Anion gap 9 5 - 15  Pregnancy, urine     Status: None   Collection Time: 08/04/19 11:37 AM  Result Value Ref Range   Preg Test, Ur NEGATIVE NEGATIVE  Hepatic function panel     Status: None   Collection Time: 08/04/19 11:37 AM  Result Value Ref Range   Total Protein 7.5 6.5 - 8.1 g/dL   Albumin 4.5 3.5 - 5.0 g/dL   AST 20 15 - 41 U/L   ALT 32 0 - 44 U/L   Alkaline Phosphatase 65 38 - 126  U/L   Total Bilirubin 0.7 0.3 - 1.2 mg/dL   Bilirubin, Direct <0.1 0.0 - 0.2 mg/dL   Indirect Bilirubin NOT CALCULATED 0.3 - 0.9 mg/dL  Lipase, blood     Status: None   Collection Time: 08/04/19 11:37 AM  Result Value Ref Range   Lipase 22 11 - 51 U/L  Wet prep, genital     Status: Abnormal   Collection Time: 08/04/19  6:03 PM   Specimen: Cervix  Result Value Ref Range   Yeast Wet Prep HPF POC NONE SEEN NONE SEEN   Trich, Wet Prep NONE SEEN NONE SEEN   Clue Cells Wet Prep HPF POC NONE SEEN NONE SEEN   WBC, Wet Prep HPF POC RARE (A) NONE SEEN   Sperm NONE SEEN      Imaging:  Ct Abdomen Pelvis W Contrast  Result Date: 08/04/2019 CLINICAL DATA:  Left-sided upper abdominal and flank pain. EXAM: CT ABDOMEN AND PELVIS WITH CONTRAST TECHNIQUE: Multidetector CT imaging of the abdomen and pelvis was performed using the standard protocol following bolus administration of intravenous contrast. CONTRAST:  162mL OMNIPAQUE IOHEXOL 300 MG/ML  SOLN COMPARISON:  Pelvic ultrasound 12/21/2018 FINDINGS: Lower chest: The lung bases are clear of acute process. No pleural effusion or pulmonary lesions. The heart is normal in size. No pericardial effusion. The distal esophagus and aorta are unremarkable. Hepatobiliary: No focal hepatic lesions or intrahepatic biliary dilatation. The gallbladder is surgically absent. No  common bile duct dilatation. Pancreas: No mass, inflammation or ductal dilatation. Spleen: Normal size.  No focal lesions. Adrenals/Urinary Tract: The adrenal glands are normal. No renal, ureteral or bladder calculi or mass is identified. There is a small cortical defect involving the inferior pole of the right kidney which may be an old infarct or cortical scar from infection. The bladder is unremarkable. I do not see any findings for cystitis or pyelonephritis. Stomach/Bowel: The stomach, duodenum, small bowel and colon are unremarkable. No acute inflammatory changes, mass lesions or obstructive findings. The terminal ileum is normal. The appendix is not identified but I do not see any findings to suggest acute appendicitis. Vascular/Lymphatic: The aorta is normal in caliber. No dissection. The branch vessels are patent. The major venous structures are patent. No mesenteric or retroperitoneal mass or adenopathy. Small scattered lymph nodes are noted. Reproductive: The uterus contains an IUD. The top portion of the IUD appears to have eroded into the myometrium and the left side is very close to the serosal surface. This may not be retrievable at this point but would certainly be worried about erosion completely through the uterine wall. There are 3 sizable ovarian and paraovarian cysts. These all appear simple without nodularity or enhancement. They all measure less than 10 Hounsfield units. These were also noted on prior ultrasound from 2019. The right adnexal cyst measures 4 cm, the left adnexal cyst measures 4.4 cm and the larger cyst in between these 2 and more posterior measures 5.5 cm. Other: No pelvic adenopathy or free pelvic fluid collections. No inguinal mass or adenopathy. Musculoskeletal: No significant bony findings. IMPRESSION: 1. No acute abdominal/pelvic findings, mass lesions or adenopathy. 2. No renal, ureteral or bladder calculi to account for the patient's left flank pain. 3. The IUD has eroded  into the myometrium at the fundal region of the uterus and is very close to the serosal surface. It may not be retrievable and I would be worried about it eventually eroding completely through the wall of the uterus. Recommend GYN consultation. 4.  Three sizable adnexal and paraadnexal cysts have benign CT imaging features. Ultrasound follow-up may be useful. 5. Status post cholecystectomy.  No biliary dilatation. Electronically Signed   By: Marijo Sanes M.D.   On: 08/04/2019 16:03   US Pelvic Complete With Transvaginal  Result Date: 08/04/2019 CLINICAL DATA:  Abdominal and pelvic pain; LMP 07/28/2019 EXAM: TRANSABDOMINAL AND TRANSVAGINAL ULTRASOUND OF PELVIS TECHNIQUE: Both transabdominal and transvaginal ultrasound examinations of the pelvis were performed. Transabdominal technique was performed for global imaging of the pelvis including uterus, ovaries, adnexal regions, and pelvic cul-de-sac. It was necessary to proceed with endovaginal exam following the transabdominal exam to visualize the endometrium, and to characterize BILATERAL cystic adnexal lesions. COMPARISON:  12/20/2017 FINDINGS: Uterus Measurements: 7.7 x 4.5 x 5.1 cm = volume: 93 mL. Anteverted. Nabothian cysts at cervix. No additional uterine mass Endometrium Thickness: 4 mm. IUD identified at the upper uterine segment, appears to extend intramural at the RIGHT fundus posteriorly. No endometrial fluid. Right ovary Measurements: 4.8 x 4.4 x 3.7 cm = volume: 42 mL. Simple cyst RIGHT ovary 3.3 x 3.3 x 3.2 cm (previously 4.9 x 2.5 x 5.0 cm) Left ovary Measurements: 4.2 x 3.6 x 3.4 cm = volume: 27 mL. Cyst within LEFT ovary 3.3 x 2.5 x 2.7 cm, simple (previously 4.9 x 3.3 x 4.5 cm) Other findings Paraovarian cyst in RIGHT adnexa, 5.7 x 3.7 x 5.4 cm, simple, extending from adjacent to RIGHT ovary to nearly LEFT ovary. IMPRESSION: IUD appears to extend intramural at the posterior upper RIGHT uterus. Simple appearing cysts within RIGHT ovary 3.3 cm  greatest size and LEFT ovary 3.3 cm greatest size, both decreased since previous exam. Simple appearing RIGHT paraovarian cyst 5.7 cm greatest size, new. Electronically Signed   By: Lavonia Dana M.D.   On: 08/04/2019 17:49     Assessment / Plan:   Katelyn Oneal is a 43 y.o. with   1. Possible migrated IUD On ultrasound seems to be in mid uterus.  There is a linear finding on transverse images but it is too small to be a full IUD perhaps an arm, perhaps an incidental finding.  I think there is a fibroid posteriorly that was not identified on ultrasound, and the snapshot images themselves are incomplete.  There are a few videos but they too do not show all of the information needed to complete this evaluation.    On ultrasound, there are simple cysts that have decreased in size from previous images, and a new midline/right para-ovarian cyst that is 6cm and the main cause of her pain on exam.    2. She will follow up with me in the office since her pain is not acute nor as bad as when she presented to the ED, without much intervention.  Tylenol and ibuprofen work at home.   We will plan surgery to remove the cystic structures.  May replace the IUD since she is bleeding monthly and possible malposition.     Thank you for the opportunity to be involved with this patient's care.  ----- Larey Days, MD Attending Obstetrician and Gynecologist [facility], Department of OB/GYN [facility]  50 minutes were spent in room with patient and interpreter. In addition, physical exam performed, images reviewed both with patient and with consulting ED physician.  Time also spent reviewing patient's prior records, and then of course, writing this note.

## 2019-08-04 NOTE — ED Provider Notes (Signed)
4:13 PM Assumed care for off going team.   Blood pressure 122/60, pulse 62, temperature 98.6 F (37 C), temperature source Oral, resp. rate 16, height 5\' 5"  (1.651 m), weight 92.5 kg, last menstrual period 07/28/2019, SpO2 100 %.  See their HPI for full report but in brief    1. No acute abdominal/pelvic findings, mass lesions or adenopathy.  2. No renal, ureteral or bladder calculi to account for the  patient's left flank pain.  3. The IUD has eroded into the myometrium at the fundal region of  the uterus and is very close to the serosal surface. It may not be  retrievable and I would be worried about it eventually eroding  completely through the wall of the uterus. Recommend GYN  consultation.  4. Three sizable adnexal and paraadnexal cysts have benign CT  imaging features. Ultrasound follow-up may be useful.  5. Status post cholecystectomy. No biliary dilatation.   4:21 PM reevaluated patient with [place] interpreter.  Patient endorses some generalized abdominal pain but more LLQ.   Discussed the above findings.  Will discuss with the OB team.  4:30 PM d/w dr. Leonides Schanz with OB recommend [place].   More pain on the left side then on the IUD itself.   IUD appears to extend intramural at the posterior upper RIGHT uterus.  Simple appearing cysts within RIGHT ovary 3.3 cm greatest size and LEFT ovary 3.3 cm greatest size, both decreased since previous exam.  Simple appearing RIGHT paraovarian cyst 5.7 cm greatest size, new.  7:15 PM pelvic exam complete.  Some minimal discharge.  No cervical motion tenderness.  IUD strings in place.  Dr. Leonides Schanz personally evaluated patient.  Patient cleared to follow-up outpatient.  Patient's pain is well controlled not on any medications and she is no evidence of peritonitis.  Patient was comfortable with this plan.  I discussed the provisional nature of ED diagnosis, the treatment so far, the ongoing plan of care, follow up appointments and return  precautions with the patient and any family or support people present. They expressed understanding and agreed with the plan, discharged home.            Vanessa Sunrise, MD 08/04/19 [phone]

## 2019-08-04 NOTE — ED Provider Notes (Signed)
[facility] Emergency Department Provider Note   ____________________________________________   First MD Initiated Contact with Patient 08/04/19 1337     (approximate)  I have reviewed the triage vital signs and the nursing notes.   HISTORY  Chief Complaint Flank Pain and Abdominal Pain    HPI Katelyn Oneal is a 43 y.o. female with no significant past medical history who presents to the ED complaining of flank pain.  Patient reports onset of left flank pain radiating toward her left lower quadrant since last night.  She describes the pain as constant and sharp, not exacerbated or alleviated by anything.  She has been feeling nauseous with this, but denies any vomiting or changes in her bowel movements.  She has not had any fevers, dysuria, hematuria, vaginal bleeding, or vaginal discharge.  She states her last menstrual period was on October 25.        Past Medical History:  Diagnosis Date  . Varicose vein     There are no active problems to display for this patient.   Past Surgical History:  Procedure Laterality Date  . CHOLECYSTECTOMY      Prior to Admission medications   Medication Sig Start Date End Date Taking? Authorizing Provider  albuterol (PROVENTIL HFA;VENTOLIN HFA) 108 (90 Base) MCG/ACT inhaler Inhale 2 puffs into the lungs every 6 (six) hours as needed for wheezing or shortness of breath. 01/19/18   Darel Hong, MD  ibuprofen (ADVIL,MOTRIN) 600 MG tablet Take 1 tablet (600 mg total) by mouth every 8 (eight) hours as needed. 01/19/18   Darel Hong, MD    Allergies Patient has no known allergies.  No family history on file.  Social History Social History   Tobacco Use  . Smoking status: Never Smoker  . Smokeless tobacco: Never Used  Substance Use Topics  . Alcohol use: No  . Drug use: Not on file    Review of Systems  Constitutional: No fever/chills Eyes: No visual changes. ENT: No sore throat.  Cardiovascular: Denies chest pain. Respiratory: Denies shortness of breath. Gastrointestinal: Positive for flank pain and abdominal pain.  No nausea, no vomiting.  No diarrhea.  No constipation. Genitourinary: Negative for dysuria. Musculoskeletal: Negative for back pain. Skin: Negative for rash. Neurological: Negative for headaches, focal weakness or numbness.  ____________________________________________   PHYSICAL EXAM:  VITAL SIGNS: ED Triage Vitals  Enc Vitals Group     BP 08/04/19 1136 122/60     Pulse Rate 08/04/19 1136 62     Resp 08/04/19 1136 16     Temp 08/04/19 1136 98.6 F (37 C)     Temp Source 08/04/19 1136 Oral     SpO2 08/04/19 1136 100 %     Weight 08/04/19 1144 204 lb (92.5 kg)     Height 08/04/19 1144 5\' 5"  (1.651 m)     Head Circumference --      Peak Flow --      Pain Score 08/04/19 1144 8     Pain Loc --      Pain Edu? --      Excl. in [place]? --     Constitutional: Alert and oriented. Eyes: Conjunctivae are normal. Head: Atraumatic. Nose: No congestion/rhinnorhea. Mouth/Throat: Mucous membranes are moist. Neck: Normal ROM Cardiovascular: Normal rate, regular rhythm. Grossly normal heart sounds. Respiratory: Normal respiratory effort.  No retractions. Lungs CTAB. Gastrointestinal: Soft and tender to palpation in the left lower quadrant, no CVA tenderness. No distention. Genitourinary: deferred Musculoskeletal: No lower  extremity tenderness nor edema. Neurologic:  Normal speech and language. No gross focal neurologic deficits are appreciated. Skin:  Skin is warm, dry and intact. No rash noted. Psychiatric: Mood and affect are normal. Speech and behavior are normal.  ____________________________________________   LABS (all labs ordered are listed, but only abnormal results are displayed)  Labs Reviewed  URINALYSIS, COMPLETE (UACMP) WITH MICROSCOPIC - Abnormal; Notable for the following components:      Result Value   Color, Urine COLORLESS (*)     APPearance CLEAR (*)    Specific Gravity, Urine 1.003 (*)    All other components within normal limits  CBC - Abnormal; Notable for the following components:   WBC 10.7 (*)    All other components within normal limits  BASIC METABOLIC PANEL  PREGNANCY, URINE  HEPATIC FUNCTION PANEL  LIPASE, BLOOD     PROCEDURES  Procedure(s) performed (including Critical Care):  Procedures   ____________________________________________   INITIAL IMPRESSION / ASSESSMENT AND PLAN / ED COURSE       43 year old female presents to the ED complaining of approximately 12 hours of left flank pain radiating into her left lower quadrant.  Pregnancy testing is negative and UA is unremarkable, no evidence of pyelonephritis.  Remainder of labs are unremarkable.  CT was obtained and negative for kidney stone, or other acute process, but does show erosion of IUD into patient's myometrium, which could explain her pain.  IUD appears to have almost reach the serosal surface and OB/GYN will need to be consulted.  Patient turned over to Dr. Jari Pigg pending OB/GYN consultation.  Patient declines pain medication at this time.      ____________________________________________   FINAL CLINICAL IMPRESSION(S) / ED DIAGNOSES  Final diagnoses:  Left flank pain     ED Discharge Orders    None       Note:  This document was prepared using Dragon voice recognition software and may include unintentional dictation errors.   Blake Divine, MD 08/04/19 1616

## 2019-08-04 NOTE — ED Triage Notes (Addendum)
Pt arrived via POV with reports of left flank pain, pt states the pain started last night and radiates towards the the left side.  Pt denies any dysuria or hematuria. Pt reports hx of kidney infections in the past.  Pt states she has been taking ibuprofen for the pain which she states has helped some.  Pt was COVID positive in August

## 2019-08-15 ENCOUNTER — Other Ambulatory Visit: Payer: Medicaid Other

## 2019-08-16 ENCOUNTER — Other Ambulatory Visit: Admission: RE | Admit: 2019-08-16 | Payer: Medicaid Other | Source: Ambulatory Visit

## 2019-08-16 ENCOUNTER — Other Ambulatory Visit: Payer: Medicaid Other

## 2019-08-16 ENCOUNTER — Other Ambulatory Visit
Admission: RE | Admit: 2019-08-16 | Discharge: 2019-08-16 | Disposition: A | Discharge status: Home / Self Care | Payer: Medicaid Other | Source: Ambulatory Visit | Attending: Obstetrics & Gynecology | Admitting: Obstetrics & Gynecology

## 2019-08-16 DIAGNOSIS — Z01812 Encounter for preprocedural laboratory examination: Secondary | ICD-10-CM | POA: Insufficient documentation

## 2019-08-16 DIAGNOSIS — Z20828 Contact with and (suspected) exposure to other viral communicable diseases: Secondary | ICD-10-CM | POA: Diagnosis not present

## 2019-08-16 LAB — SARS CORONAVIRUS 2 (TAT 6-24 HRS): SARS Coronavirus 2: NEGATIVE

## 2019-08-19 ENCOUNTER — Ambulatory Visit: Payer: Medicaid Other | Admitting: Anesthesiology

## 2019-08-19 ENCOUNTER — Other Ambulatory Visit: Payer: Self-pay

## 2019-08-19 ENCOUNTER — Encounter: Payer: Self-pay | Admitting: *Deleted

## 2019-08-19 ENCOUNTER — Encounter
Admission: RE | Disposition: A | Discharge status: Home / Self Care | Payer: Self-pay | Source: Home / Self Care | Attending: Obstetrics & Gynecology

## 2019-08-19 ENCOUNTER — Ambulatory Visit
Admission: RE | Admit: 2019-08-19 | Discharge: 2019-08-19 | Disposition: A | Discharge status: Home / Self Care | Payer: Medicaid Other | Attending: Obstetrics & Gynecology | Admitting: Obstetrics & Gynecology

## 2019-08-19 DIAGNOSIS — R102 Pelvic and perineal pain unspecified side: Secondary | ICD-10-CM | POA: Diagnosis present

## 2019-08-19 DIAGNOSIS — Z302 Encounter for sterilization: Secondary | ICD-10-CM | POA: Insufficient documentation

## 2019-08-19 DIAGNOSIS — N838 Other noninflammatory disorders of ovary, fallopian tube and broad ligament: Secondary | ICD-10-CM | POA: Diagnosis not present

## 2019-08-19 DIAGNOSIS — N9489 Other specified conditions associated with female genital organs and menstrual cycle: Secondary | ICD-10-CM | POA: Diagnosis present

## 2019-08-19 DIAGNOSIS — N83292 Other ovarian cyst, left side: Secondary | ICD-10-CM | POA: Diagnosis not present

## 2019-08-19 DIAGNOSIS — N83512 Torsion of left ovary and ovarian pedicle: Secondary | ICD-10-CM | POA: Diagnosis not present

## 2019-08-19 HISTORY — PX: LAPAROSCOPIC OVARIAN CYSTECTOMY: SHX6248

## 2019-08-19 HISTORY — PX: LAPAROSCOPIC TUBAL LIGATION: SHX1937

## 2019-08-19 HISTORY — PX: IUD REMOVAL: SHX5392

## 2019-08-19 LAB — TYPE AND SCREEN
ABO/RH(D): A POS
Antibody Screen: NEGATIVE

## 2019-08-19 LAB — POCT PREGNANCY, URINE: Preg Test, Ur: NEGATIVE

## 2019-08-19 SURGERY — REMOVAL, INTRAUTERINE DEVICE
Anesthesia: General

## 2019-08-19 MED ORDER — PROPOFOL 500 MG/50ML IV EMUL
INTRAVENOUS | Status: AC
  Filled 2019-08-19: qty 50

## 2019-08-19 MED ORDER — GABAPENTIN 300 MG PO CAPS
ORAL_CAPSULE | ORAL | Status: AC
  Administered 2019-08-19: 600 mg via ORAL
  Filled 2019-08-19: qty 2

## 2019-08-19 MED ORDER — KETOROLAC TROMETHAMINE 60 MG/2ML IM SOLN
INTRAMUSCULAR | Status: AC
  Filled 2019-08-19: qty 2

## 2019-08-19 MED ORDER — ONDANSETRON HCL 4 MG/2ML IJ SOLN
INTRAMUSCULAR | Status: DC | PRN
  Administered 2019-08-19: 4 mg via INTRAVENOUS

## 2019-08-19 MED ORDER — PROPOFOL 10 MG/ML IV BOLUS
INTRAVENOUS | Status: DC | PRN
  Administered 2019-08-19: 170 mg via INTRAVENOUS
  Administered 2019-08-19: 20 mg via INTRAVENOUS

## 2019-08-19 MED ORDER — FENTANYL CITRATE (PF) 100 MCG/2ML IJ SOLN
25.0000 ug | INTRAMUSCULAR | Status: DC | PRN
  Administered 2019-08-19 (×4): 25 ug via INTRAVENOUS

## 2019-08-19 MED ORDER — HEPARIN SODIUM (PORCINE) 5000 UNIT/ML IJ SOLN
INTRAMUSCULAR | Status: AC
  Administered 2019-08-19: 5000 [IU] via SUBCUTANEOUS
  Filled 2019-08-19: qty 1

## 2019-08-19 MED ORDER — FENTANYL CITRATE (PF) 100 MCG/2ML IJ SOLN
INTRAMUSCULAR | Status: AC
  Filled 2019-08-19: qty 2

## 2019-08-19 MED ORDER — DEXAMETHASONE SODIUM PHOSPHATE 10 MG/ML IJ SOLN
4.0000 mg | INTRAMUSCULAR | Status: AC
  Administered 2019-08-19: 12:00:00 4 mg via INTRAVENOUS

## 2019-08-19 MED ORDER — MIDAZOLAM HCL 2 MG/2ML IJ SOLN
INTRAMUSCULAR | Status: AC
  Filled 2019-08-19: qty 2

## 2019-08-19 MED ORDER — SCOPOLAMINE 1 MG/3DAYS TD PT72
MEDICATED_PATCH | TRANSDERMAL | Status: AC
  Administered 2019-08-19: 1.5 mg via TRANSDERMAL
  Filled 2019-08-19: qty 1

## 2019-08-19 MED ORDER — LIDOCAINE HCL (PF) 2 % IJ SOLN
INTRAMUSCULAR | Status: AC
  Filled 2019-08-19: qty 10

## 2019-08-19 MED ORDER — LACTATED RINGERS IV SOLN
INTRAVENOUS | Status: DC
  Administered 2019-08-19 (×2): via INTRAVENOUS

## 2019-08-19 MED ORDER — KETOROLAC TROMETHAMINE 30 MG/ML IJ SOLN
15.0000 mg | INTRAMUSCULAR | Status: AC
  Administered 2019-08-19: 15 mg via INTRAVENOUS

## 2019-08-19 MED ORDER — ROCURONIUM BROMIDE 50 MG/5ML IV SOLN
INTRAVENOUS | Status: AC
  Filled 2019-08-19: qty 1

## 2019-08-19 MED ORDER — SCOPOLAMINE 1 MG/3DAYS TD PT72
1.0000 | MEDICATED_PATCH | TRANSDERMAL | Status: DC
  Administered 2019-08-19: 11:00:00 1.5 mg via TRANSDERMAL

## 2019-08-19 MED ORDER — ROCURONIUM BROMIDE 100 MG/10ML IV SOLN
INTRAVENOUS | Status: DC | PRN
  Administered 2019-08-19: 10 mg via INTRAVENOUS
  Administered 2019-08-19: 40 mg via INTRAVENOUS
  Administered 2019-08-19: 10 mg via INTRAVENOUS

## 2019-08-19 MED ORDER — FENTANYL CITRATE (PF) 100 MCG/2ML IJ SOLN
INTRAMUSCULAR | Status: DC | PRN
  Administered 2019-08-19: 25 ug via INTRAVENOUS
  Administered 2019-08-19: 50 ug via INTRAVENOUS
  Administered 2019-08-19: 75 ug via INTRAVENOUS

## 2019-08-19 MED ORDER — MORPHINE SULFATE (PF) 4 MG/ML IV SOLN: 1.0000 mg | INTRAVENOUS | Status: DC | PRN

## 2019-08-19 MED ORDER — ACETAMINOPHEN 500 MG PO TABS
ORAL_TABLET | ORAL | Status: AC
  Administered 2019-08-19: 1000 mg via ORAL
  Filled 2019-08-19: qty 2

## 2019-08-19 MED ORDER — EPHEDRINE SULFATE 50 MG/ML IJ SOLN
INTRAMUSCULAR | Status: DC | PRN
  Administered 2019-08-19: 15 mg via INTRAVENOUS

## 2019-08-19 MED ORDER — FENTANYL CITRATE (PF) 100 MCG/2ML IJ SOLN
INTRAMUSCULAR | Status: AC
  Administered 2019-08-19: 25 ug via INTRAVENOUS
  Filled 2019-08-19: qty 2

## 2019-08-19 MED ORDER — ACETAMINOPHEN 325 MG PO TABS: 650.0000 mg | ORAL_TABLET | ORAL | Status: DC | PRN

## 2019-08-19 MED ORDER — HEPARIN SODIUM (PORCINE) 5000 UNIT/ML IJ SOLN
5000.0000 [IU] | INTRAMUSCULAR | Status: AC
  Administered 2019-08-19: 11:00:00 5000 [IU] via SUBCUTANEOUS

## 2019-08-19 MED ORDER — MIDAZOLAM HCL 2 MG/2ML IJ SOLN
INTRAMUSCULAR | Status: DC | PRN
  Administered 2019-08-19: 2 mg via INTRAVENOUS

## 2019-08-19 MED ORDER — ACETAMINOPHEN 650 MG RE SUPP: 650.0000 mg | RECTAL | Status: DC | PRN

## 2019-08-19 MED ORDER — OXYCODONE HCL 5 MG PO TABS: 5.0000 mg | ORAL_TABLET | Freq: Three times a day (TID) | ORAL | 0 refills | Status: AC | PRN

## 2019-08-19 MED ORDER — SILVER NITRATE-POT NITRATE 75-25 % EX MISC
CUTANEOUS | Status: DC | PRN
  Administered 2019-08-19: 1

## 2019-08-19 MED ORDER — ONDANSETRON HCL 4 MG/2ML IJ SOLN: 4.0000 mg | Freq: Once | INTRAMUSCULAR | Status: DC | PRN

## 2019-08-19 MED ORDER — FAMOTIDINE 20 MG PO TABS
20.0000 mg | ORAL_TABLET | Freq: Once | ORAL | Status: AC
  Administered 2019-08-19: 20 mg via ORAL

## 2019-08-19 MED ORDER — GABAPENTIN 300 MG PO CAPS
600.0000 mg | ORAL_CAPSULE | ORAL | Status: AC
  Administered 2019-08-19: 11:00:00 600 mg via ORAL

## 2019-08-19 MED ORDER — DEXAMETHASONE SODIUM PHOSPHATE 10 MG/ML IJ SOLN
INTRAMUSCULAR | Status: AC
  Administered 2019-08-19: 4 mg via INTRAVENOUS
  Filled 2019-08-19: qty 1

## 2019-08-19 MED ORDER — SUGAMMADEX SODIUM 200 MG/2ML IV SOLN
INTRAVENOUS | Status: DC | PRN
  Administered 2019-08-19: 200 mg via INTRAVENOUS

## 2019-08-19 MED ORDER — LACTATED RINGERS IV SOLN: INTRAVENOUS | Status: DC

## 2019-08-19 MED ORDER — LIDOCAINE HCL (CARDIAC) PF 100 MG/5ML IV SOSY
PREFILLED_SYRINGE | INTRAVENOUS | Status: DC | PRN
  Administered 2019-08-19: 100 mg via INTRAVENOUS

## 2019-08-19 MED ORDER — ACETAMINOPHEN 500 MG PO TABS
1000.0000 mg | ORAL_TABLET | ORAL | Status: AC
  Administered 2019-08-19: 11:00:00 1000 mg via ORAL

## 2019-08-19 MED ORDER — FAMOTIDINE 20 MG PO TABS
ORAL_TABLET | ORAL | Status: AC
  Filled 2019-08-19: qty 1

## 2019-08-19 SURGICAL SUPPLY — 56 items
BAG URINE DRAIN 2000ML AR STRL (UROLOGICAL SUPPLIES) ×5 IMPLANT
BLADE SURG SZ11 CARB STEEL (BLADE) ×5 IMPLANT
CANISTER SUCT 1200ML W/VALVE (MISCELLANEOUS) ×5 IMPLANT
CATH FOLEY 2WAY  5CC 16FR (CATHETERS) ×4
CATH ROBINSON RED A/P 16FR (CATHETERS) ×5 IMPLANT
CATH URTH 16FR FL 2W BLN LF (CATHETERS) ×3 IMPLANT
CHLORAPREP W/TINT 26 (MISCELLANEOUS) ×5 IMPLANT
CLOSURE WOUND 1/2 X4 (GAUZE/BANDAGES/DRESSINGS) ×1
COVER WAND RF STERILE (DRAPES) ×5 IMPLANT
DERMABOND ADVANCED (GAUZE/BANDAGES/DRESSINGS) ×2
DERMABOND ADVANCED .7 DNX12 (GAUZE/BANDAGES/DRESSINGS) ×3 IMPLANT
DRAPE LEGGINS SURG 28X43 STRL (DRAPES) ×5 IMPLANT
DRAPE UNDER BUTTOCK W/FLU (DRAPES) ×5 IMPLANT
DRSG TELFA 3X8 NADH (GAUZE/BANDAGES/DRESSINGS) ×5 IMPLANT
ELECT REM PT RETURN 9FT ADLT (ELECTROSURGICAL) ×5
ELECTRODE REM PT RTRN 9FT ADLT (ELECTROSURGICAL) ×3 IMPLANT
GLOVE PI ORTHOPRO 6.5 (GLOVE) ×2
GLOVE PI ORTHOPRO STRL 6.5 (GLOVE) ×3 IMPLANT
GLOVE SURG SYN 6.5 ES PF (GLOVE) ×5 IMPLANT
GLOVE SURG SYN 6.5 PF PI (GLOVE) ×3 IMPLANT
GOWN STRL REUS W/ TWL LRG LVL3 (GOWN DISPOSABLE) ×6 IMPLANT
GOWN STRL REUS W/ TWL XL LVL3 (GOWN DISPOSABLE) ×3 IMPLANT
GOWN STRL REUS W/TWL LRG LVL3 (GOWN DISPOSABLE) ×4
GOWN STRL REUS W/TWL XL LVL3 (GOWN DISPOSABLE) ×2
GRASPER SUT TROCAR 14GX15 (MISCELLANEOUS) ×2 IMPLANT
IRRIGATION STRYKERFLOW (MISCELLANEOUS) ×3 IMPLANT
IRRIGATOR STRYKERFLOW (MISCELLANEOUS)
IV LACTATED RINGERS 1000ML (IV SOLUTION) ×5 IMPLANT
KIT PINK PAD W/HEAD ARE REST (MISCELLANEOUS) ×5
KIT PINK PAD W/HEAD ARM REST (MISCELLANEOUS) ×3 IMPLANT
KIT TURNOVER CYSTO (KITS) ×10 IMPLANT
L-HOOK LAP DISP 36CM (ELECTROSURGICAL) ×5
LABEL OR SOLS (LABEL) ×5 IMPLANT
LHOOK LAP DISP 36CM (ELECTROSURGICAL) IMPLANT
LIGASURE VESSEL 5MM BLUNT TIP (ELECTROSURGICAL) ×2 IMPLANT
MANIPULATOR UTERINE 4.5 ZUMI (MISCELLANEOUS) IMPLANT
NS IRRIG 500ML POUR BTL (IV SOLUTION) ×5 IMPLANT
PACK DNC HYST (MISCELLANEOUS) ×3 IMPLANT
PACK LAP CHOLECYSTECTOMY (MISCELLANEOUS) ×5 IMPLANT
PAD DRESSING TELFA 3X8 NADH (GAUZE/BANDAGES/DRESSINGS) IMPLANT
PAD OB MATERNITY 4.3X12.25 (PERSONAL CARE ITEMS) ×5 IMPLANT
PAD PREP 24X41 OB/GYN DISP (PERSONAL CARE ITEMS) ×10 IMPLANT
POUCH ENDO CATCH 10MM SPEC (MISCELLANEOUS) ×4 IMPLANT
POUCH SPECIMEN RETRIEVAL 10MM (ENDOMECHANICALS) IMPLANT
SET TUBE SMOKE EVAC HIGH FLOW (TUBING) ×5 IMPLANT
SLEEVE ENDOPATH XCEL 5M (ENDOMECHANICALS) ×8 IMPLANT
STRIP CLOSURE SKIN 1/2X4 (GAUZE/BANDAGES/DRESSINGS) ×1 IMPLANT
SUT MNCRL 4-0 (SUTURE) ×2
SUT MNCRL 4-0 27XMFL (SUTURE) ×3
SUT MNCRL AB 4-0 PS2 18 (SUTURE) ×3 IMPLANT
SUT VIC AB 2-0 UR6 27 (SUTURE) ×5 IMPLANT
SUTURE MNCRL 4-0 27XMF (SUTURE) ×3 IMPLANT
TOWEL OR 17X26 4PK STRL BLUE (TOWEL DISPOSABLE) ×5 IMPLANT
TROCAR 5M 150ML BLDLS (TROCAR) ×3 IMPLANT
TROCAR ENDO BLADELESS 11MM (ENDOMECHANICALS) ×2 IMPLANT
TROCAR XCEL NON-BLD 5MMX100MML (ENDOMECHANICALS) ×5 IMPLANT

## 2019-08-19 NOTE — Transfer of Care (Signed)
Immediate Anesthesia Transfer of Care Note  Patient: Lavonda Imdieke  Procedure(s) Performed: INTRAUTERINE DEVICE (IUD) REMOVAL (N/A ) LAPAROSCOPIC TUBAL LIGATION (Bilateral ) LAPAROSCOPIC OVARIAN CYSTECTOMY (Left )  Patient Location: PACU  Anesthesia Type:General  Level of Consciousness: sedated  Airway & Oxygen Therapy: Patient Spontanous Breathing and Patient connected to face mask oxygen  Post-op Assessment: Report given to RN and Post -op Vital signs reviewed and stable  Post vital signs: Reviewed and stable  Last Vitals:  Vitals Value Taken Time  BP 98/54 08/19/19 1632  Temp 36.2 C 08/19/19 1632  Pulse 69 08/19/19 1636  Resp 14 08/19/19 1636  SpO2 99 % 08/19/19 1636  Vitals shown include unvalidated device data.  Last Pain:  Vitals:   08/19/19 1100  TempSrc: Tympanic  PainSc: 2          Complications: No apparent anesthesia complications

## 2019-08-19 NOTE — Anesthesia Postprocedure Evaluation (Signed)
Anesthesia Post Note  Patient: Katelyn Oneal  Procedure(s) Performed: INTRAUTERINE DEVICE (IUD) REMOVAL (N/A ) LAPAROSCOPIC TUBAL LIGATION (Bilateral ) LAPAROSCOPIC OVARIAN CYSTECTOMY (Left )  Patient location during evaluation: PACU Anesthesia Type: General Level of consciousness: awake and alert Pain management: pain level controlled Vital Signs Assessment: post-procedure vital signs reviewed and stable Respiratory status: spontaneous breathing, nonlabored ventilation, respiratory function stable and patient connected to nasal cannula oxygen Cardiovascular status: stable and blood pressure returned to baseline Postop Assessment: no apparent nausea or vomiting Anesthetic complications: no     Last Vitals:  Vitals:   08/19/19 1744 08/19/19 1806  BP: 113/68 104/62  Pulse: 76 72  Resp: 14 14  Temp: (!) 36.4 C (!) 36.4 C  SpO2: 95% 95%    Last Pain:  Vitals:   08/19/19 1806  TempSrc:   PainSc: 0-No pain                 Molli Barrows

## 2019-08-19 NOTE — H&P (Signed)
Preoperative History and Physical  Kimmora Raker is a 43 y.o.  here for surgical management of pelvic pain - found to have a large ovarian /adnexal mass and desires permanent sterilization.   No significant preoperative concerns.  The sterilization consent was signed 08/04/19 and she was to be scheduled 31 days from that time, however her pain has acutely worsened and the determination was made that she should go to the OR sooner than her originally scheduled date.  Due to this, the amendment will be signed on her medicaid papers.  Proposed surgery:removal of IUD, removal of adnexal mass, and bilateral tubal ligation  Past Medical History:  Diagnosis Date  . Varicose vein    Past Surgical History:  Procedure Laterality Date  . CHOLECYSTECTOMY     OB History  No obstetric history on file.  Patient denies any other pertinent gynecologic issues.   No current facility-administered medications on file prior to encounter.    Current Outpatient Medications on File Prior to Encounter  Medication Sig Dispense Refill  . Ascorbic Acid (VITAMIN C) 1000 MG tablet Take 1,000 mg by mouth daily.    . Biotin 10000 MCG TABS Take 10,000 mcg by mouth daily.    Marland Kitchen ibuprofen (ADVIL) 200 MG tablet Take 400 mg by mouth every 6 (six) hours as needed for headache or moderate pain.     No Known Allergies  Social History:   reports that she has never smoked. She has never used smokeless tobacco. She reports that she does not drink alcohol.  History reviewed. No pertinent family history.  Review of Systems: Noncontributory  PHYSICAL EXAM: Blood pressure 128/74, pulse 62, temperature (!) 97.2 F (36.2 C), temperature source Tympanic, last menstrual period 07/28/2019, SpO2 100 %. General appearance - alert, well appearing, and in no distress Chest - clear to auscultation, no wheezes, rales or rhonchi, symmetric air entry Heart - normal rate and regular rhythm Abdomen - soft, nontender,  nondistended, no masses or organomegaly Pelvic - examination not indicated Extremities - peripheral pulses normal, no pedal edema, no clubbing or cyanosis  Labs: Results for orders placed or performed during the hospital encounter of 08/19/19 (from the past 336 hour(s))  Type and screen [place]   Collection Time: 08/19/19 12:00 PM  Result Value Ref Range   ABO/RH(D) A POS    Antibody Screen NEG    Sample Expiration      08/22/2019,2359 Performed at [facility], [address] [place],  [address]   Results for orders placed or performed during the hospital encounter of 08/16/19 (from the past 336 hour(s))  SARS CORONAVIRUS 2 (TAT 6-24 HRS) Nasopharyngeal Nasopharyngeal Swab   Collection Time: 08/16/19  8:24 AM   Specimen: Nasopharyngeal Swab  Result Value Ref Range   SARS Coronavirus 2 NEGATIVE NEGATIVE    Imaging Studies: Ct Abdomen Pelvis W Contrast  Result Date: 08/04/2019 CLINICAL DATA:  Left-sided upper abdominal and flank pain. EXAM: CT ABDOMEN AND PELVIS WITH CONTRAST TECHNIQUE: Multidetector CT imaging of the abdomen and pelvis was performed using the standard protocol following bolus administration of intravenous contrast. CONTRAST:  130mL OMNIPAQUE IOHEXOL 300 MG/ML  SOLN COMPARISON:  Pelvic ultrasound 12/21/2018 FINDINGS: Lower chest: The lung bases are clear of acute process. No pleural effusion or pulmonary lesions. The heart is normal in size. No pericardial effusion. The distal esophagus and aorta are unremarkable. Hepatobiliary: No focal hepatic lesions or intrahepatic biliary dilatation. The gallbladder is surgically absent. No common bile  duct dilatation. Pancreas: No mass, inflammation or ductal dilatation. Spleen: Normal size.  No focal lesions. Adrenals/Urinary Tract: The adrenal glands are normal. No renal, ureteral or bladder calculi or mass is identified. There is a small cortical defect involving the inferior pole of the  right kidney which may be an old infarct or cortical scar from infection. The bladder is unremarkable. I do not see any findings for cystitis or pyelonephritis. Stomach/Bowel: The stomach, duodenum, small bowel and colon are unremarkable. No acute inflammatory changes, mass lesions or obstructive findings. The terminal ileum is normal. The appendix is not identified but I do not see any findings to suggest acute appendicitis. Vascular/Lymphatic: The aorta is normal in caliber. No dissection. The branch vessels are patent. The major venous structures are patent. No mesenteric or retroperitoneal mass or adenopathy. Small scattered lymph nodes are noted. Reproductive: The uterus contains an IUD. The top portion of the IUD appears to have eroded into the myometrium and the left side is very close to the serosal surface. This may not be retrievable at this point but would certainly be worried about erosion completely through the uterine wall. There are 3 sizable ovarian and paraovarian cysts. These all appear simple without nodularity or enhancement. They all measure less than 10 Hounsfield units. These were also noted on prior ultrasound from 2019. The right adnexal cyst measures 4 cm, the left adnexal cyst measures 4.4 cm and the larger cyst in between these 2 and more posterior measures 5.5 cm. Other: No pelvic adenopathy or free pelvic fluid collections. No inguinal mass or adenopathy. Musculoskeletal: No significant bony findings. IMPRESSION: 1. No acute abdominal/pelvic findings, mass lesions or adenopathy. 2. No renal, ureteral or bladder calculi to account for the patient's left flank pain. 3. The IUD has eroded into the myometrium at the fundal region of the uterus and is very close to the serosal surface. It may not be retrievable and I would be worried about it eventually eroding completely through the wall of the uterus. Recommend GYN consultation. 4. Three sizable adnexal and paraadnexal cysts have benign CT  imaging features. Ultrasound follow-up may be useful. 5. Status post cholecystectomy.  No biliary dilatation. Electronically Signed   By: Marijo Sanes M.D.   On: 08/04/2019 16:03   US Pelvic Complete With Transvaginal  Result Date: 08/04/2019 CLINICAL DATA:  Abdominal and pelvic pain; LMP 07/28/2019 EXAM: TRANSABDOMINAL AND TRANSVAGINAL ULTRASOUND OF PELVIS TECHNIQUE: Both transabdominal and transvaginal ultrasound examinations of the pelvis were performed. Transabdominal technique was performed for global imaging of the pelvis including uterus, ovaries, adnexal regions, and pelvic cul-de-sac. It was necessary to proceed with endovaginal exam following the transabdominal exam to visualize the endometrium, and to characterize BILATERAL cystic adnexal lesions. COMPARISON:  12/20/2017 FINDINGS: Uterus Measurements: 7.7 x 4.5 x 5.1 cm = volume: 93 mL. Anteverted. Nabothian cysts at cervix. No additional uterine mass Endometrium Thickness: 4 mm. IUD identified at the upper uterine segment, appears to extend intramural at the RIGHT fundus posteriorly. No endometrial fluid. Right ovary Measurements: 4.8 x 4.4 x 3.7 cm = volume: 42 mL. Simple cyst RIGHT ovary 3.3 x 3.3 x 3.2 cm (previously 4.9 x 2.5 x 5.0 cm) Left ovary Measurements: 4.2 x 3.6 x 3.4 cm = volume: 27 mL. Cyst within LEFT ovary 3.3 x 2.5 x 2.7 cm, simple (previously 4.9 x 3.3 x 4.5 cm) Other findings Paraovarian cyst in RIGHT adnexa, 5.7 x 3.7 x 5.4 cm, simple, extending from adjacent to RIGHT ovary to nearly LEFT  ovary. IMPRESSION: IUD appears to extend intramural at the posterior upper RIGHT uterus. Simple appearing cysts within RIGHT ovary 3.3 cm greatest size and LEFT ovary 3.3 cm greatest size, both decreased since previous exam. Simple appearing RIGHT paraovarian cyst 5.7 cm greatest size, new. Electronically Signed   By: Lavonia Dana M.D.   On: 08/04/2019 17:49    Assessment: There are no active problems to display for this  patient.   Plan: Patient will undergo surgical management with removal of IUD, removal of adnexal mass, and bilateral tubal ligation.   The risks of surgery were discussed in detail with the patient including but not limited to: bleeding which may require transfusion or reoperation; infection which may require antibiotics; injury to surrounding organs which may involve bowel, bladder, ureters ; need for additional procedures including laparoscopy or laparotomy; thromboembolic phenomenon, surgical site problems and other postoperative/anesthesia complications. Likelihood of success in alleviating the patient's condition was discussed. Routine postoperative instructions will be reviewed with the patient and her family in detail after surgery.  The patient concurred with the proposed plan, giving informed written consent for the surgery.  Patient has been NPO since last night she will remain NPO for procedure.  Anesthesia and OR aware.  Preoperative prophylactic antibiotics and SCDs ordered on call to the OR.  To OR when ready.  ----- Larey Days, MD, [place] Attending Obstetrician and Gynecologist [facility], Department of [facility] . 08/19/2019 1:49 PM

## 2019-08-19 NOTE — Discharge Instructions (Signed)
Discharge instructions:  Call office if you have any of the following: fever >101 F, chills, shortness of breath, excessive vaginal bleeding, incision drainage or problems, leg pain or redness, or any other concerns.   Activity: Do not lift > 10 lbs for 8 weeks.  No driving for 1-2 weeks.   You may feel some pain in your upper right abdomen/rib and right shoulder.  This is from the gas in the abdomen for surgery. This will subside over time, please be patient!  Take 600mg  Ibuprofen and 1000mg  Tylenol around the clock, every 6 hours for at least the first 3-5 days.  After this you can take as needed.  This will help decrease inflammation and promote healing.  The narcotics you'll take just as needed, as they just trick your brain into thinking its not in pain.    Please don't limit yourself in terms of routine activity.  You will be able to do most things, although they may take longer to do or be a little painful.  You can do it!  Don't be a hero, but don't be a wimp either!    Recanalizacin de la ligadura de trompas, cuidados posteriores Tubal Ligation Reversal, Care After Lea esta informacin sobre cmo cuidarse despus del procedimiento. Su mdico tambin podr darle indicaciones ms especficas. Comunquese con su mdico si tiene problemas o preguntas. Qu puedo esperar despus del procedimiento? Despus del procedimiento, es comn [employer] siguientes sntomas:  Molestias abdominales leves. Esto puede incluir lo siguiente: ? Calambres leves. ? Dolores ocasionados por gases o sensacin de hinchazn. ? Dolor o Office manager de la incisin.  Malestar en la zona del hombro. Esto se produce cuando el aire queda atrapado entre el hgado y [place]. Este Data processing manager por s solo.  Cansancio.  Dolor de garganta si tena un tubo para respirar.  Nuseas o vmitos. Siga estas indicaciones en su casa: Medicamentos  [employer] de venta libre y  los recetados solamente como se lo haya indicado el mdico.  No tome aspirina, ya que puede causar hemorragias.  No conduzca ni use maquinaria pesada mientras toma analgsicos recetados. Actividad  Haga reposo como se lo haya indicado el mdico. Retome sus actividades normales de a poco como se lo haya indicado el mdico.  Evite estar sentado durante largos perodos sin moverse. Levntese y [employer] o ms veces en intervalos de unas pocas horas.  No tenga relaciones sexuales, no use tampones ni se haga duchas vaginales durante todo el ciclo menstrual o segn le indique el mdico.  No levante ningn objeto que pese ms de 10libras (4,5kg) o como se lo haya indicado el mdico.  Pida que alguien la ayude con los quehaceres diarios durante los primeros 7 a 10das, o segn lo recomendado por el mdico. Cuidados de la incisin   Siga las indicaciones del mdico acerca del cuidado de la incisin. Haga lo siguiente: ? Lvese las manos con agua y jabn antes de Quar[place] manager las vendas (vendaje). Use desinfectante para manos si no dispone de [place]. ? Cambie el vendaje como se lo haya indicado el mdico. ? No retire los puntos (suturas), la goma para cerrar la piel o las tiras [place]. Es posible que estos cierres cutneos Animal nutritionist en la piel durante 2semanas o ms tiempo. Si los bordes de las tiras adhesivas empiezan a despegarse y Therapist, sports, puede recortar los que estn sueltos.  No tome baos de inmersin, no nade ni use el  jacuzzi hasta que el mdico lo autorice. Puede ducharse.  [place] zona de la incisin para detectar signos de infeccin. Est atento a los siguientes signos: ? Dolor, hinchazn o enrojecimiento. ? Lquido o sangre. ? Calor. ? Pus o mal olor. Instrucciones generales  A fin de prevenir o tratar el estreimiento mientras toma analgsicos recetados, el mdico puede recomendarle lo siguiente: ? Beber suficiente lquido para mantener la  orina de color amarillo plido. ? Tomar medicamentos recetados o de [employer]. ? Consumir alimentos ricos en fibra, como frutas y verduras frescas, cereales integrales y frijoles. ? Limitar el consumo de alimentos ricos en grasas y azcares procesados, como alimentos fritos o dulces.  No consuma ningn producto que contenga nicotina o tabaco, como cigarrillos y Psychologist, sport and exercise. Estos pueden retrasar la recuperacin. Si necesita ayuda para dejar de fumar, consulte al mdico.  Concurra a todas las visitas de control como se lo haya indicado el mdico. Esto es importante. Debern hacerle una radiografa de control con un medio de contraste de 3a 9meses despus de la ciruga para asegurarse de que las trompas estn abiertas. Comunquese con un mdico si:  Tiene signos de infeccin, como los siguientes: ? Enrojecimiento, hinchazn o dolor prximos al lugar de la incisin. ? Sangre o lquido provenientes de la incisin. ? La incisin est caliente al tacto. ? Pus o mal olor provenientes de una incisin.  La herida se abre.  Tiene fiebre.  Tiene una erupcin cutnea.  Se siente mareado o sufre un desmayo.  No puede comer ni beber sin vomitar.  Tiene estreimiento. Solicite ayuda de inmediato si:  Le falta el aire o tiene dificultad para respirar.  Siente dolor en el pecho o en las piernas.  Tiene mareos recurrentes.  Se desmaya.  Siente ms dolor en el abdomen.  Tiene sensacin de ardor o dolor al [employer].  Tiene un sangrado vaginal abundante y no es el momento de su perodo menstrual. Resumen  Es comn tener dolor de garganta, dolor abdominal leve y fatiga despus del procedimiento.  Tome los medicamentos de venta libre y los recetados solamente como se lo haya indicado el mdico.  Consulting civil engineer a todas las visitas de control como se lo haya indicado el mdico. Esto es importante. Debern hacerle una radiografa de control con un medio de contraste de 3a 91meses  despus de la ciruga para asegurarse de que las trompas estn abiertas. Esta informacin no tiene Marine scientist el consejo del mdico. Asegrese de hacerle al mdico cualquier pregunta que tenga. Document Released: 09/07/2009 Document Revised: 04/25/2017 Document Reviewed: 04/25/2017 Elsevier Patient Education  [place].  Tubal Ligation Reversal, Care After This sheet gives you information about how to care for yourself after your procedure. Your health care provider may also give you more specific instructions. If you have problems or questions, contact your health care provider. What can I expect after the procedure? After the procedure, it is common to have:  Mild abdominal discomfort. This can include: ? Mild cramping. ? Gas pains or feeling bloated. ? Pain or soreness at the incision areas.  Discomfort in the shoulder area. This is caused by air that is trapped between your liver and your diaphragm. The discomfort will slowly go away on its own.  Tiredness.  A sore throat if you had a breathing tube.  Nausea or vomiting. Follow these instructions at home: Medicines  Take over-the-counter and prescription medicines only as told by your health care provider.  Do  not take aspirin because it can cause bleeding.  Do not drive or use heavy machinery while taking prescription pain medicine. Activity  Rest as told by your health care provider. Gradually return to your normal activities as told by your health care provider.  Avoid sitting for a long time without moving. Get up and move around one or more times every few hours.  Do not douche, use tampons, or have sexual intercourse for an entire menstrual cycle, or as told by your health care provider.  Do not lift anything that is heavier than 10 lb (4.5 kg), or as told by your health care provider.  Have someone help you with your daily household tasks for the first 7-10 days, or as recommended by your health  care provider. Incision care   Follow instructions from your health care provider about how to take care of your incision. Make sure you: ? Wash your hands with soap and water before you change your bandage (dressing). If soap and water are not available, use hand sanitizer. ? Change your dressing as told by your health care provider. ? Leave stitches (sutures), skin glue, or adhesive strips in place. These skin closures may need to stay in place for 2 weeks or longer. If adhesive strip edges start to loosen and curl up, you may trim the loose edges.  Do not take baths, swim, or use a hot tub until your health care provider approves. You may take showers.  Check your incision area every day for signs of infection. Check for: ? Redness, swelling, or pain. ? Fluid or blood. ? Warmth. ? Pus or a bad smell. General instructions  To prevent or treat constipation while you are taking prescription pain medicine, your health care provider may recommend that you: ? Drink enough fluid to keep your urine pale yellow. ? Take over-the-counter or prescription medicines. ? Eat foods that are high in fiber, such as fresh fruits and vegetables, whole grains, and beans. ? Limit foods that are high in fat and processed sugars, such as fried and sweet foods.  Do not use any products that contain nicotine or tobacco, such as cigarettes and e-cigarettes. These can delay healing. If you need help quitting, ask your health care provider.  Keep all follow-up visits as told by your health care provider. This is important. You will need to have a follow-up X-ray dye test about 3-4 months after the surgery to make sure that your tubes are open. Contact a health care provider if:  You have signs of infection, such as: ? Redness, swelling, or pain around your incision sites. ? Fluid or blood coming from an incision. ? An incision that feels warm to the touch. ? Pus or a bad smell coming from an incision.  Your  incision breaks open.  You have a fever.  You have a rash.  You feel dizzy or lightheaded.  You cannot eat or drink without vomiting.  You are constipated. Get help right away if:  You have shortness of breath or difficulty breathing.  You have chest or leg pain.  You repeatedly feel lightheaded.  You faint.  You have increasing pain in your abdomen.  You feel a burning sensation or pain when you urinate.  You have heavy vaginal bleeding when it is not time for your menstrual period. Summary  It is common to have a sore throat, mild abdominal pain, and fatigue after the procedure.  Take over-the-counter and prescription medicines only as told by  your health care provider.  Keep all follow-up visits as told by your health care provider. This is important. You will need to have a follow-up X-ray dye test about 3-4 months after the surgery to make sure that your tubes are open. This information is not intended to replace advice given to you by your health care provider. Make sure you discuss any questions you have with your health care provider. Document Released: 07/17/2009 Document Revised: 09/01/2017 Document Reviewed: 12/29/2016 Elsevier Patient Education  [place].  Ovarian Cystectomy, Care After This sheet gives you information about how to care for yourself after your procedure. Your health care provider may also give you more specific instructions. If you have problems or questions, contact your health care provider. What can I expect after the procedure? After the procedure, it is common to have:  Pain in your abdomen, especially at the incision areas. You will be given pain medicines to control the pain.  Tiredness. This is a normal part of the recovery process. Your energy level will return to normal over the next several weeks.  Problems passing stool (constipation). Follow these instructions at home: Medicines  Take over-the-counter and  prescription medicines only as told by your health care provider.  If you were prescribed an antibiotic medicine, use it as told by your health care provider. Do not stop using the antibiotic even if you start to feel better.  Do not take aspirin because it can cause bleeding.  Do not drink alcohol while taking prescription pain medicine.  Do not drive or use heavy machinery while taking prescription pain medicine. Incision care   Follow instructions from your health care provider about how to take care of your incisions. Make sure you: ? Wash your hands with soap and water before you change your bandage (dressing). If soap and water are not available, use hand sanitizer. ? Change your dressing as told by your health care provider. ? Leave stitches (sutures), skin glue, or adhesive strips in place. These skin closures may need to stay in place for 2 weeks or longer. If adhesive strip edges start to loosen and curl up, you may trim the loose edges. Do not remove adhesive strips completely unless your health care provider tells you to do that.  Check your incision areas every day for signs of infection. Check for: ? Redness, swelling, or pain. ? Fluid or blood. ? Warmth. ? Pus or a bad smell.  Do not take baths, swim, or use a hot tub until your health care provider approves. Take showers instead of baths. Activity  Return to your normal activities and diet as told by your health care provider. Ask your health care provider what activities are safe for you.  Take rest breaks during the day as needed.  Do not drive until your health care provider approves. General instructions  Do not douche, use tampons, or have sexual intercourse until your health care provider says it is okay to do so.  To prevent or treat constipation while you are taking prescription pain medicine, your health care provider may recommend that you: ? Take over-the-counter or prescription medicines. ? Eat foods  that are high in fiber, such as fresh fruits and vegetables, whole grains, and beans. ? Drink enough fluid to keep your urine clear or pale yellow. ? Limit foods that are high in fat and processed sugars, such as fried and sweet foods.  Keep all follow-up visits as told by your health care provider. This  is important. Contact a health care provider if:  You have a fever.  You feel nauseous or you vomit.  You have pain when you urinate or have blood in your urine.  You have a rash on your body.  You have pain or redness where the IV was inserted.  You have pain that is not relieved with medicine.  You have signs of infection, such as: ? Redness, swelling, or pain around your incisions. ? Fluid or blood coming from your incisions. ? An incision that feels warm to the touch. ? Pus or a bad smell coming from your incisions. Get help right away if:  You have chest pain or shortness of breath.  You feel dizzy or light-headed.  You have increasing abdominal pain that is not relieved with medicines.  You have pain, swelling, or redness in your leg.  Your incision is opening (the edges are not staying together). Summary  After the procedure, it is common to have some pain in your abdomen. You will be given pain medicines to control the pain.  Follow instructions from your health care provider about how to take care of your incisions.  Do not douche, use tampons, or have sexual intercourse until your health care provider says it is okay to do so.  Keep all follow-up visits as told by your health care provider. This is important. This information is not intended to replace advice given to you by your health care provider. Make sure you discuss any questions you have with your health care provider. Document Released: 07/10/2013 Document Revised: 09/01/2017 Document Reviewed: 11/08/2016 Elsevier Patient Education  2020 [place], cuidados  posteriores Ovarian Cystectomy, Care After Lea esta informacin sobre cmo cuidarse despus del procedimiento. El mdico tambin podr darle instrucciones ms especficas. Comunquese con su mdico si tiene problemas o preguntas. Qu puedo esperar despus del procedimiento? Despus del procedimiento, es comn tener los siguientes sntomas:  Dolor en el abdomen, especialmente en los lugares de las incisiones. Le darn analgsicos para Financial controller.  Cansancio. Esta es una etapa normal del proceso de recuperacin. Su nivel de Research officer, trade union a la normalidad en las prximas semanas.  Problemas para defecar (estreimiento). Siga estas indicaciones en su casa: Medicamentos  [employer] de venta libre y los recetados solamente como se lo haya indicado el mdico.  Si le recetaron un antibitico, tmelo como se lo haya indicado el mdico. No deje de usar el antibitico aunque comience a Sports administrator.  No tome aspirina, ya que puede causar hemorragias.  No consuma alcohol mientras est tomando analgsicos recetados.  No conduzca ni use maquinaria pesada mientras toma analgsicos recetados. Cuidados de la incisin   Siga las indicaciones del mdico acerca del cuidado de las incisiones. Haga lo siguiente: ? Lvese las manos con agua y jabn antes de cambiar la venda (vendaje). Use desinfectante para manos si no dispone de [place]. ? Cambie el vendaje como se lo haya indicado el mdico. ? No retire los puntos (suturas), la goma para cerrar la piel o las tiras [place]. Es posible que estos cierres cutneos Animal nutritionist en la piel durante 2semanas o ms. Si los bordes de las tiras adhesivas empiezan a despegarse y Therapist, sports, puede recortar los que estn sueltos. No retire las tiras [facility] por completo a menos que el mdico se lo indique.  [place] incisiones para detectar signos de infeccin. Est atento a los siguientes signos: ? Dolor,  hinchazn  o enrojecimiento. ? Lquido o sangre. ? Calor. ? Pus o mal olor.  No tome baos de inmersin, no nade ni use el jacuzzi hasta que el mdico lo autorice. Tome Dentist de baos. Actividad  Reanude sus actividades normales y su dieta como se lo haya indicado el mdico. Pregntele al mdico qu actividades son seguras para usted.  Durante el da, descanse cuanto sea necesario.  No conduzca hasta que el mdico lo autorice. Instrucciones generales  No se haga duchas vaginales, no utilice tampones ni tenga relaciones sexuales hasta que el mdico lo autorice.  A fin de prevenir o tratar el estreimiento mientras toma analgsicos recetados, el mdico puede recomendarle lo siguiente: ? Tomar medicamentos recetados o de [employer]. ? Consumir alimentos ricos en fibra, como frutas y verduras frescas, cereales integrales y frijoles. ? Beber suficiente lquido como para mantener la orina clara o de color amarillo plido. ? Limitar el consumo de alimentos ricos en grasa y azcares procesados, como alimentos fritos o dulces.  Concurra a todas las visitas de control como se lo haya indicado el mdico. Esto es importante. Comunquese con un mdico si:  Tiene fiebre.  Siente nuseas o vomita.  Siente dolor al orinar u observa sangre en la orina.  Tiene una erupcin cutnea en el cuerpo.  Tiene dolor o Neurosurgeon donde se coloc la va intravenosa.  Siente un dolor que no se alivia con medicamentos.  Tiene signos de infeccin, como los siguientes: ? Enrojecimiento, hinchazn o dolor alrededor de las incisiones. ? [place] incisiones. ? Alguna incisin est caliente al tacto. ? Tiene pus o percibe que sale mal olor de las incisiones. Solicite ayuda de inmediato si:  Siente falta de aire o dolor en el pecho.  Se siente mareada o que va a desvanecerse.  El dolor abdominal aumenta y no se alivia con medicamentos.  Siente dolor o  tiene hinchazn o enrojecimiento en la pierna.  La incisin se abre (los bordes no permanecen juntos). Resumen  Despus del procedimiento, es comn tener un poco de dolor en el abdomen. Le darn analgsicos para Financial controller.  Siga las indicaciones del mdico acerca del cuidado de las incisiones.  No se haga duchas vaginales, no utilice tampones ni tenga relaciones sexuales hasta que el mdico lo autorice.  Concurra a todas las visitas de control como se lo haya indicado el mdico. Esto es importante. Esta informacin no tiene Marine scientist el consejo del mdico. Asegrese de hacerle al mdico cualquier pregunta que tenga. Document Released: 07/10/2013 Document Revised: 06/29/2017 Document Reviewed: 06/29/2017 Elsevier Patient Education  2020 [place] AMBULATORIA       Instruccionnes de alta   1.  Las drogas que se Statistician en su cuerpo [employer], asi que por las proximas 24 horas usted no debe:   Conducir Scientist, research (medical)) un automovil   Hacer ninguna decision legal   Tomar ninguna bebida alcoholica  2.  A) [place] puede comenzar una dieta regular.  Es mejor que hoy empiece con liquidos y gradualmente anada comidas solidas.       B) Puede comer cualquier comida que desee pero es mejor empezar con liquidos, luego sopitas con galletas saladas y gradualmente llegar a las comidas solidas.  3.  Por favor avise a su medico inmediatamente si usted tiene algun sangrado anormal, tiene dificultad con la respiracion, enrojecimiento y Social research officer, government en el sitio de  la [place],     drenaje, fiebro o dolor que se [place] con [place].  4.  Por favor llame para hacer la cita posoperatoria.  5.  Istrucciones especificas :  AMBULATORY SURGERY  DISCHARGE INSTRUCTIONS   1) The drugs that you were given will stay in your system until tomorrow so for the next 24 hours you should not:  A) Drive an automobile B) Make any legal decisions C) Drink any  alcoholic beverage   2) You may resume regular meals tomorrow.  Today it is better to start with liquids and gradually work up to solid foods.  You may eat anything you prefer, but it is better to start with liquids, then soup and crackers, and gradually work up to solid foods.   3) Please notify your doctor immediately if you have any unusual bleeding, trouble breathing, redness and pain at the surgery site, drainage, fever, or pain not relieved by medication.    4) Additional Instructions:    Please contact your physician with any problems or Same Day Surgery at [phone], Monday through Friday 6 am to 4 pm, or [place] at [facility] number at [phone].

## 2019-08-19 NOTE — Anesthesia Post-op Follow-up Note (Signed)
Anesthesia QCDR form completed.        

## 2019-08-19 NOTE — Anesthesia Preprocedure Evaluation (Addendum)
Anesthesia Evaluation  Patient identified by MRN, date of birth, ID band Patient awake    Reviewed: Allergy & Precautions, NPO status , Patient's Chart, lab work & pertinent test results  History of Anesthesia Complications (+) AWARENESS UNDER ANESTHESIA and history of anesthetic complications (Pt with awareness with Cholecystectomy in [place] with the stiches at the end of the procedure)  Airway Mallampati: II       Dental   Pulmonary neg sleep apnea, neg COPD, Not current smoker,           Cardiovascular (-) hypertension(-) Past MI and (-) CHF (-) dysrhythmias (-) Valvular Problems/Murmurs     Neuro/Psych neg Seizures    GI/Hepatic Neg liver ROS, neg GERD  ,  Endo/Other  neg diabetes  Renal/GU negative Renal ROS     Musculoskeletal   Abdominal   Peds  Hematology   Anesthesia Other Findings   Reproductive/Obstetrics                             Anesthesia Physical Anesthesia Plan  ASA: I  Anesthesia Plan: General   Post-op Pain Management:    Induction: Intravenous  PONV Risk Score and Plan: 3 and Dexamethasone, Ondansetron and Midazolam  Airway Management Planned: Oral ETT  Additional Equipment:   Intra-op Plan:   Post-operative Plan:   Informed Consent: I have reviewed the patients History and Physical, chart, labs and discussed the procedure including the risks, benefits and alternatives for the proposed anesthesia with the patient or authorized representative who has indicated his/her understanding and acceptance.       Plan Discussed with:   Anesthesia Plan Comments:         Anesthesia Quick Evaluation

## 2019-08-19 NOTE — Anesthesia Procedure Notes (Signed)
Procedure Name: Intubation Date/Time: 08/19/2019 2:08 PM Performed by: Allean Found, CRNA Pre-anesthesia Checklist: Patient identified, Emergency Drugs available, Suction available, Patient being monitored and Timeout performed Patient Re-evaluated:Patient Re-evaluated prior to induction Oxygen Delivery Method: Circle system utilized Preoxygenation: Pre-oxygenation with 100% oxygen Induction Type: IV induction Ventilation: Mask ventilation without difficulty Laryngoscope Size: McGraph and 3 Grade View: Grade I Tube type: Oral Tube size: 7.0 mm Number of attempts: 1 Airway Equipment and Method: Stylet Placement Confirmation: ETT inserted through vocal cords under direct vision,  positive ETCO2 and breath sounds checked- equal and bilateral Secured at: 21 cm Tube secured with: Tape Dental Injury: Teeth and Oropharynx as per pre-operative assessment

## 2019-08-20 ENCOUNTER — Encounter: Payer: Self-pay | Admitting: Obstetrics & Gynecology

## 2019-08-21 LAB — SURGICAL PATHOLOGY

## 2019-08-21 LAB — CYTOLOGY - NON PAP

## 2019-08-26 NOTE — Op Note (Addendum)
[facility] PROCEDURE DATE: 08/19/2019  PATIENT:  Katelyn Oneal  43 y.o. female  PRE-OPERATIVE DIAGNOSIS:  adnexal mass, acutely worsening pelvic pain, desired permanent sterilization.  POST-OPERATIVE DIAGNOSIS:  adnexal mass, ovarian torsion, desired permanent sterilization.  PROCEDURE:  Procedure(s): INTRAUTERINE DEVICE (IUD) REMOVAL (N/A) LAPAROSCOPIC TUBAL LIGATION (Bilateral) LAPAROSCOPIC OVARIAN CYSTECTOMY (Left)  SURGEON:  Surgeon(s) and Role:    * Daleigh Pollinger, Honor Loh, MD - Primary  ANESTHESIA:  General via ET  I/O  See flowsheet  FINDINGS:   Normal vulva, vagina and cervix, mobile uterus with mobile posterior culdesac mass  Normal upper abdomen. Normal uterus, right tube and ovary LEFT fallopian enlarged paratubal cyst with torsed, enlarged cystic ovary. IUD removed intact  SPECIMEN: pelvic washings, left fallopian tube (with cyst) and portion or right fallopian tube, segments of left ovarian cyst  COMPLICATIONS: none apparent  DISPOSITION: vital signs stable to PACU   Indication for Surgery: 43 y.o. who presented to the ED with abdominal pain, found to have bilateral adnexal masses,   Risks of surgery were discussed with the patient including but not limited to: bleeding which may require transfusion or reoperation; infection which may require antibiotics; injury to bowel, bladder, ureters or other surrounding organs; need for additional procedures including laparotomy, blood clot, incisional problems and other postoperative/anesthesia complications. Written informed consent was obtained.     PROCEDURE IN DETAIL:  The patient had sequential compression devices applied to her lower extremities while in the preoperative area.  She was then taken to the operating room where general anesthesia was administered and was found to be adequate.  She was placed in the dorsal lithotomy position, and was prepped and draped in a sterile manner.  A Foley catheter  was inserted into her bladder and attached to constant drainage.  A ringed forceps was used to grasp the visible strings and the IUD was removed without difficulty and intact. A uterine manipulator was then advanced into the uterus .  After a surgical timeout was performed, attention was turned to the abdomen where an umbilical incision was made with the scalpel. A 41mm trochar was inserted in the umbilical incision using a visiport method. Opening pressure was 47mmHg, and the abdomen was insufflated to 38mmHg carbon dioxide gas and adequate pneumoperitoneum was obtained.  A survey of the patient's pelvis and abdomen revealed the findings as mentioned above. Two 37mm ports were inserted in the lower left and right quadrants under visualization.    The right fallopian tube was grasped and the tube and mesosalpinx was divided with the ligasure proximally and distally. This portion was removed and handed off to nursing.  There were two left sided cystic processes, one was in the fallopian tube (with large paratubal cyst and the other was the ovary, and they were intertwined with each other and torsed. Neither were discolored.  The fallopian tube was grasped, unwound from the ovarian pedicle, and the mesosalpinx was divided with the Ligasure and the tube and cyst were placed in the posterior culdesac.  The left ovary was supported and the cautery hook was used to divide the ovarian capsule and expose the underlying cyst.  During manipulation the cyst wall was ruptured for clear serous fluid. The cyst wall was grasped in fragments with all attempts to preserve the ovarian stroma.  These pieces were collectively sent as segments of ovarian cyst.  An endopouch was inserted into the umbilical port and the fallopian tube was placed inside the bag.  The bag  was brought through the umbilical port site, ruptured, drained, and removed from the abdomen.   The operative sites were surveyed, and it was found to be hemostatic.   No intraoperative injury to surrounding organs was noted.  The abdomen was desufflated and all instruments were then removed from the patient's abdomen. The uterine manipulator was removed without complications.  The fascia of the umbilical incision was closed with 2-0 vicryl.  All skin incisions were closed with 4-0 monocryl and covered with surgical glue. The patient tolerated the procedures well.  All instruments, needles, and sponge counts were correct x 2. The patient was taken to the recovery room in stable condition.   ---- Larey Days, MD Attending Obstetrician and [facility]

## 2020-01-04 ENCOUNTER — Ambulatory Visit: Payer: Medicaid Other | Attending: Internal Medicine

## 2020-01-04 DIAGNOSIS — Z23 Encounter for immunization: Secondary | ICD-10-CM

## 2020-01-04 NOTE — Progress Notes (Signed)
   Covid-19 Vaccination Clinic  Name:  Katelyn Oneal    MRN: [id number] DOB: 1975-12-28  01/04/2020  [name] was observed post Covid-19 immunization for 15 minutes without incident. She was provided with Vaccine Information Sheet and instruction to access the V-Safe system.   [name] was instructed to call 911 with any severe reactions post vaccine: Marland Kitchen Difficulty breathing  . Swelling of face and throat  . A fast heartbeat  . A bad rash all over body  . Dizziness and weakness   Immunizations Administered    Name Date Dose VIS Date Route   Pfizer COVID-19 Vaccine 01/04/2020  9:17 AM 0.3 mL 09/13/2019 Intramuscular   Manufacturer: [place]   Lot: [id number]   [place]: [id number]

## 2020-01-25 ENCOUNTER — Ambulatory Visit: Payer: Medicaid Other | Attending: Internal Medicine

## 2020-01-25 DIAGNOSIS — Z23 Encounter for immunization: Secondary | ICD-10-CM

## 2020-01-25 NOTE — Progress Notes (Signed)
   Covid-19 Vaccination Clinic  Name:  Katelyn Oneal    MRN: [id number] DOB: 09/06/1976  01/25/2020  [name] was observed post Covid-19 immunization for 15 minutes without incident. She was provided with Vaccine Information Sheet and instruction to access the V-Safe system.   [name] was instructed to call 911 with any severe reactions post vaccine: Marland Kitchen Difficulty breathing  . Swelling of face and throat  . A fast heartbeat  . A bad rash all over body  . Dizziness and weakness   Immunizations Administered    Name Date Dose VIS Date Route   Pfizer COVID-19 Vaccine 01/25/2020  8:47 AM 0.3 mL 11/27/2018 Intramuscular   Manufacturer: [employer]   Lot: [id number]   [place]: [id number]

## 2020-02-12 ENCOUNTER — Encounter: Payer: Self-pay | Admitting: Emergency Medicine

## 2020-02-12 ENCOUNTER — Other Ambulatory Visit: Payer: Self-pay

## 2020-02-12 DIAGNOSIS — F419 Anxiety disorder, unspecified: Secondary | ICD-10-CM | POA: Insufficient documentation

## 2020-02-12 DIAGNOSIS — Z79899 Other long term (current) drug therapy: Secondary | ICD-10-CM | POA: Insufficient documentation

## 2020-02-12 NOTE — ED Triage Notes (Signed)
Patient ambulatory to triage with steady gait, without difficulty or distress noted, mask in place; per video interpreter, pt reports HA unrelieved by ibuprofen (last ds at 2pm); st pain to top of head "burning" radiating into face and neck since Monday after a stressful event

## 2020-02-13 ENCOUNTER — Emergency Department: Payer: Medicaid Other

## 2020-02-13 ENCOUNTER — Emergency Department
Admission: EM | Admit: 2020-02-13 | Discharge: 2020-02-13 | Disposition: A | Discharge status: Home / Self Care | Payer: Medicaid Other | Attending: Emergency Medicine | Admitting: Emergency Medicine

## 2020-02-13 DIAGNOSIS — F419 Anxiety disorder, unspecified: Secondary | ICD-10-CM

## 2020-02-13 MED ORDER — LORAZEPAM 0.5 MG PO TABS: 0.5000 mg | ORAL_TABLET | Freq: Three times a day (TID) | ORAL | 0 refills | Status: AC | PRN

## 2020-02-13 MED ORDER — LORAZEPAM 0.5 MG PO TABS
0.5000 mg | ORAL_TABLET | Freq: Once | ORAL | Status: AC
  Administered 2020-02-13: 0.5 mg via ORAL
  Filled 2020-02-13: qty 1
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
## 2022-03-24 ENCOUNTER — Encounter: Payer: Self-pay | Admitting: *Deleted

## 2022-03-24 ENCOUNTER — Emergency Department: Payer: Medicaid Other

## 2022-03-24 ENCOUNTER — Other Ambulatory Visit: Payer: Self-pay

## 2022-03-24 ENCOUNTER — Emergency Department
Admission: EM | Admit: 2022-03-24 | Discharge: 2022-03-25 | Disposition: A | Discharge status: Home / Self Care | Payer: Medicaid Other | Attending: Emergency Medicine | Admitting: Emergency Medicine

## 2022-03-24 DIAGNOSIS — R519 Headache, unspecified: Secondary | ICD-10-CM | POA: Insufficient documentation

## 2022-03-24 DIAGNOSIS — N9489 Other specified conditions associated with female genital organs and menstrual cycle: Secondary | ICD-10-CM | POA: Diagnosis not present

## 2022-03-24 DIAGNOSIS — D72829 Elevated white blood cell count, unspecified: Secondary | ICD-10-CM | POA: Diagnosis not present

## 2022-03-24 LAB — CBC WITH DIFFERENTIAL/PLATELET
Abs Immature Granulocytes: 0.02 10*3/uL (ref 0.00–0.07)
Basophils Absolute: 0.1 10*3/uL (ref 0.0–0.1)
Basophils Relative: 1 %
Eosinophils Absolute: 0.9 10*3/uL — ABNORMAL HIGH (ref 0.0–0.5)
Eosinophils Relative: 8 %
HCT: 37.8 % (ref 36.0–46.0)
Hemoglobin: 12.5 g/dL (ref 12.0–15.0)
Immature Granulocytes: 0 %
Lymphocytes Relative: 38 %
Lymphs Abs: 4.1 10*3/uL — ABNORMAL HIGH (ref 0.7–4.0)
MCH: 31 pg (ref 26.0–34.0)
MCHC: 33.1 g/dL (ref 30.0–36.0)
MCV: 93.8 fL (ref 80.0–100.0)
Monocytes Absolute: 0.5 10*3/uL (ref 0.1–1.0)
Monocytes Relative: 5 %
Neutro Abs: 5.3 10*3/uL (ref 1.7–7.7)
Neutrophils Relative %: 48 %
Platelets: 311 10*3/uL (ref 150–400)
RBC: 4.03 MIL/uL (ref 3.87–5.11)
RDW: 12.9 % (ref 11.5–15.5)
WBC: 10.9 10*3/uL — ABNORMAL HIGH (ref 4.0–10.5)
nRBC: 0 % (ref 0.0–0.2)

## 2022-03-24 LAB — COMPREHENSIVE METABOLIC PANEL
ALT: 36 U/L (ref 0–44)
AST: 28 U/L (ref 15–41)
Albumin: 4.3 g/dL (ref 3.5–5.0)
Alkaline Phosphatase: 69 U/L (ref 38–126)
Anion gap: 6 (ref 5–15)
BUN: 16 mg/dL (ref 6–20)
CO2: 26 mmol/L (ref 22–32)
Calcium: 9.3 mg/dL (ref 8.9–10.3)
Chloride: 106 mmol/L (ref 98–111)
Creatinine, Ser: 0.71 mg/dL (ref 0.44–1.00)
GFR, Estimated: >60 mL/min (ref >60)
Glucose, Bld: 88 mg/dL (ref 70–99)
Potassium: 3.5 mmol/L (ref 3.5–5.1)
Sodium: 138 mmol/L (ref 135–145)
Total Bilirubin: 0.2 mg/dL — ABNORMAL LOW (ref 0.3–1.2)
Total Protein: 7.2 g/dL (ref 6.5–8.1)

## 2022-03-24 MED ORDER — SODIUM CHLORIDE 0.9 % IV BOLUS
500.0000 mL | Freq: Once | INTRAVENOUS | Status: AC
  Administered 2022-03-24: 500 mL via INTRAVENOUS

## 2022-03-24 MED ORDER — METOCLOPRAMIDE HCL 5 MG/ML IJ SOLN
10.0000 mg | Freq: Once | INTRAMUSCULAR | Status: AC
  Administered 2022-03-24: 10 mg via INTRAVENOUS
  Filled 2022-03-24: qty 2

## 2022-03-24 MED ORDER — DIPHENHYDRAMINE HCL 50 MG/ML IJ SOLN
12.5000 mg | Freq: Once | INTRAMUSCULAR | Status: AC
  Administered 2022-03-24: 12.5 mg via INTRAVENOUS
  Filled 2022-03-24: qty 1

## 2022-03-24 NOTE — ED Triage Notes (Signed)
Pt has a left side headache. Began yesterday and took motrin with relief.  Again today, head started hurting and pt took motrin, no relief.  No vomiting.   Pt has swelling to left side of face.  No toothache.  No resp distress.    Pt alert, speech clear.

## 2022-03-24 NOTE — ED Notes (Signed)
Pt in mri at this time

## 2022-03-24 NOTE — ED Provider Notes (Incomplete)
[facility] Provider Note    Event Date/Time   First MD Initiated Contact with Patient 03/24/22 2203     (approximate)   History   Headache   HPI {Remember to add pertinent medical, surgical, social, and/or OB history to HPI:1} Arnold Depinto is a 46 y.o. female  ***       Physical Exam   Triage Vital Signs: ED Triage Vitals  Enc Vitals Group     BP 03/24/22 1950 139/69     Pulse Rate 03/24/22 1950 63     Resp 03/24/22 1950 18     Temp 03/24/22 1950 97.8 F (36.6 C)     Temp Source 03/24/22 1950 Oral     SpO2 03/24/22 1950 99 %     Weight 03/24/22 1951 210 lb (95.3 kg)     Height 03/24/22 1951 '5\' 1"'$  (1.549 m)     Head Circumference --      Peak Flow --      Pain Score 03/24/22 1951 7     Pain Loc --      Pain Edu? --      Excl. in [place]? --     Most recent vital signs: Vitals:   03/24/22 1950 03/24/22 2210  BP: 139/69 129/78  Pulse: 63 64  Resp: 18 17  Temp: 97.8 F (36.6 C)   SpO2: 99% 98%    {Only need to document appropriate and relevant physical exam:1} General: Awake, no distress. *** CV:  Good peripheral perfusion. *** Resp:  Normal effort. *** Abd:  No distention. *** Other:  ***   ED Results / Procedures / Treatments   Labs (all labs ordered are listed, but only abnormal results are displayed) Labs Reviewed  CBC WITH DIFFERENTIAL/PLATELET - Abnormal; Notable for the following components:      Result Value   WBC 10.9 (*)    Lymphs Abs 4.1 (*)    Eosinophils Absolute 0.9 (*)    All other components within normal limits  COMPREHENSIVE METABOLIC PANEL - Abnormal; Notable for the following components:   Total Bilirubin 0.2 (*)    All other components within normal limits  HCG, QUANTITATIVE, PREGNANCY  SEDIMENTATION RATE     EKG  ***   RADIOLOGY *** {USE THE WORD "INTERPRETED"!! You MUST document your own interpretation of imaging, as well as the fact that you reviewed the radiologist's  report!:1}   PROCEDURES:  Critical Care performed: {CriticalCareYesNo:19197::"Yes, see critical care procedure note(s)","No"}  Procedures   MEDICATIONS ORDERED IN ED: Medications  diphenhydrAMINE (BENADRYL) injection 12.5 mg (has no administration in time range)  metoCLOPramide (REGLAN) injection 10 mg (has no administration in time range)  sodium chloride 0.9 % bolus 500 mL (has no administration in time range)     IMPRESSION / MDM / ASSESSMENT AND PLAN / ED COURSE  I reviewed the triage vital signs and the nursing notes.                              Differential diagnosis includes, but is not limited to, ***  Patient's presentation is most consistent with {EM COPA:27473}  {If the patient is on the monitor, remove the brackets and asterisks on the sentence below and remember to document it as a Procedure as well. Otherwise delete the sentence below:1} {**The patient is on the cardiac monitor to evaluate for evidence of arrhythmia and/or significant heart rate changes.**} {Remember to  include, when applicable, any/all of the following data: independent review of imaging independent review of labs (comment specifically on pertinent positives and negatives) review of specific prior hospitalizations, PCP/specialist notes, etc. discuss meds given and prescribed document any discussion with consultants (including hospitalists) any clinical decision tools you used and why (PECARN, NEXUS, etc.) did you consider admitting the patient? document social determinants of health affecting patient's care (homelessness, inability to follow up in a timely fashion, etc) document any pre-existing conditions increasing risk on current visit (e.g. diabetes and HTN increasing danger of high-risk chest pain/ACS) describes what meds you gave (especially parenteral) and why any other interventions?:1}     FINAL CLINICAL IMPRESSION(S) / ED DIAGNOSES   Final diagnoses:  None     Rx / DC  Orders   ED Discharge Orders     None        Note:  This document was prepared using Dragon voice recognition software and may include unintentional dictation errors.

## 2022-03-25 LAB — HCG, QUANTITATIVE, PREGNANCY: hCG, Beta Chain, Quant, S: <1 m[IU]/mL (ref <5)

## 2022-03-25 LAB — SEDIMENTATION RATE: Sed Rate: 14 mm/hr (ref 0–20)

## 2022-03-25 MED ORDER — BUTALBITAL-APAP-CAFFEINE 50-325-40 MG PO TABS: 1.0000 | ORAL_TABLET | Freq: Four times a day (QID) | ORAL | 0 refills | Status: AC | PRN

## 2022-03-25 NOTE — ED Provider Notes (Signed)
Emergency department handoff note  Care of this patient was signed out to me at the end of the previous provider shift.  All pertinent patient information was conveyed and all questions were answered.  Patient pending MRI/MRA of the brain did not show any evidence of acute abnormalities.  The patient has been reexamined and is ready to be discharged.  All diagnostic results have been reviewed and discussed with the patient/family.  Care plan has been outlined and the patient/family understands all current diagnoses, results, and treatment plans.  There are no new complaints, changes, or physical findings at this time.  All questions have been addressed and answered.  All medications, if any, that were given while in the emergency department or any that are being prescribed have been reviewed with the patient/family.  All side effects and adverse reactions have been explained.  Patient was instructed to, and agrees to follow-up with their primary care physician as well as return to the emergency department if any new or worsening symptoms develop.   Merwyn Katos, MD 03/25/22 [phone]

## 2023-09-24 IMAGING — MR MR HEAD W/O CM
11 series · 46 of 48 positions shown · non-contrast
Comparison: None Available.

CLINICAL DATA: Left-sided headache

EXAM:
MRI HEAD WITHOUT CONTRAST
MRA HEAD WITHOUT CONTRAST
TECHNIQUE: Multiplanar, multi-echo pulse sequences of the brain and surrounding
structures were acquired without intravenous contrast. Angiographic
images of the Circle of Willis were acquired using MRA technique
without intravenous contrast.

[Series 5: ax dwi_tracew · axial · 3.0mm · 0.65mm/px · z∈[-91,+64]mm · 4 of 48 slices shown]
[im 1/48]
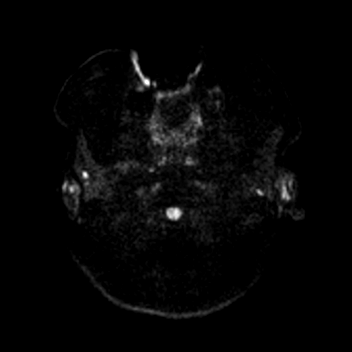
[im 16/48]
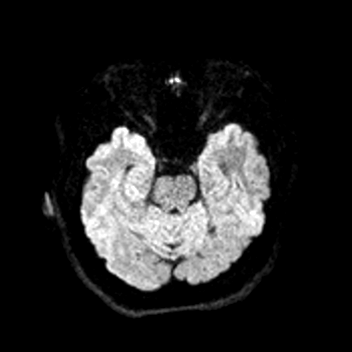
[im 32/48]
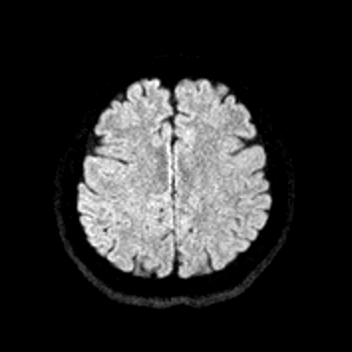
[im 48/48]
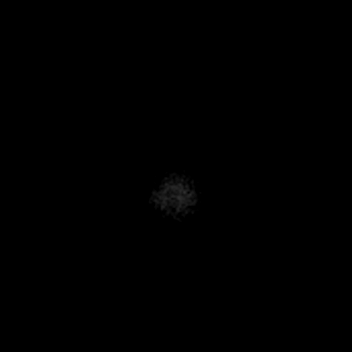

[Series 6: ax dwi_adc · axial · 3.0mm · 0.65mm/px · z∈[-91,+64]mm · 4 of 48 slices shown]
[im 1/48]
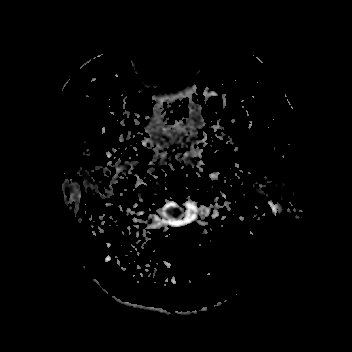
[im 16/48]
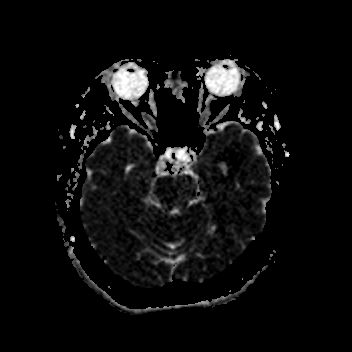
[im 32/48]
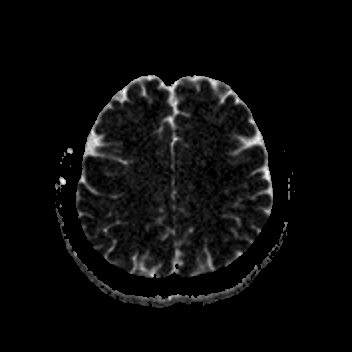
[im 48/48]
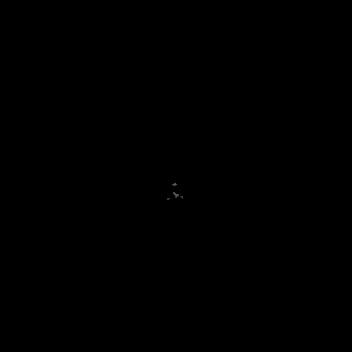

[Series 7: cor dwi_tracew · coronal · 5.0mm · 0.65mm/px · 3 of 34 slices shown]
[im 1/34]
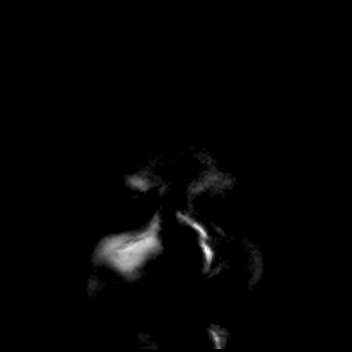
[im 17/34]
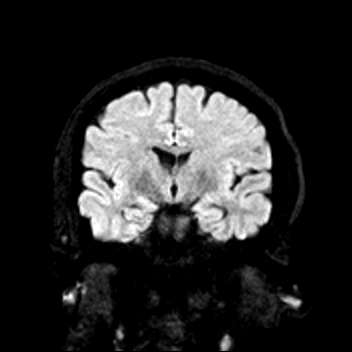
[im 34/34]
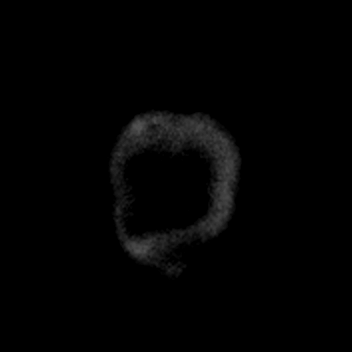

[Series 8: cor dwi_adc · coronal · 5.0mm · 0.65mm/px · 3 of 34 slices shown]
[im 1/34]
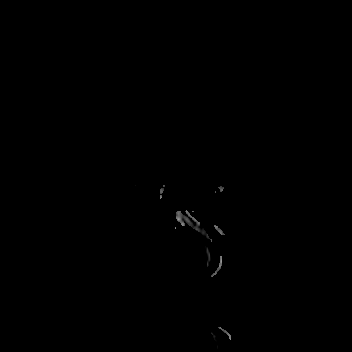
[im 17/34]
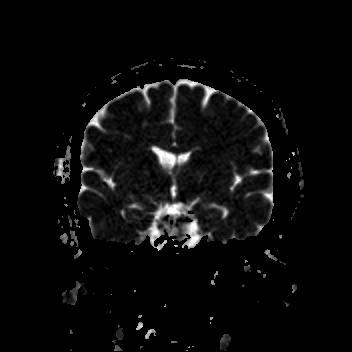
[im 34/34]
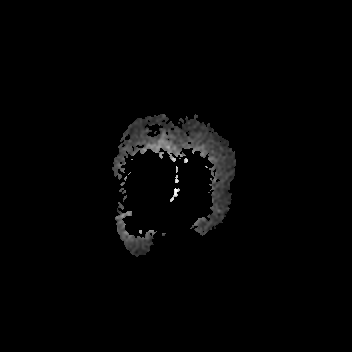

[Series 9: T1 · sagittal · 5.0mm · 0.62mm/px · 2 of 25 slices shown (1 of 2)]
[im 1/25]
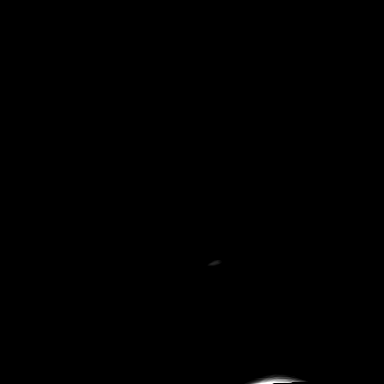
[im 25/25]
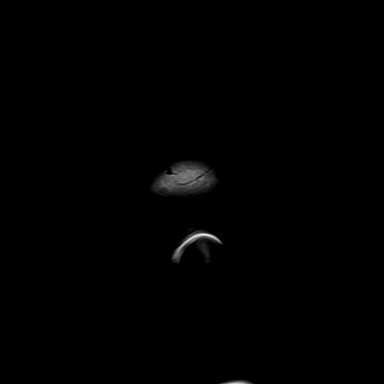

[Series 10: T2 · axial · 5.0mm · 0.53mm/px · z∈[-86,+58]mm · 2 of 25 slices shown (1 of 2)]
[im 1/25]
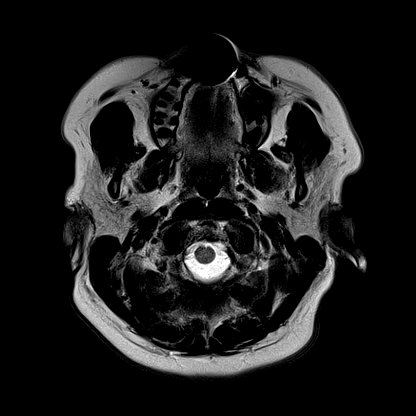
[im 25/25]
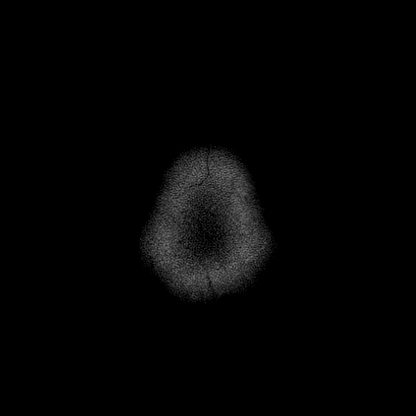

[Series 12: pha_images · axial · 3.0mm · 0.90mm/px · z∈[-102,+75]mm · 5 of 60 slices shown]
[im 1/60]
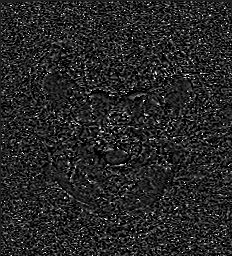
[im 15/60]
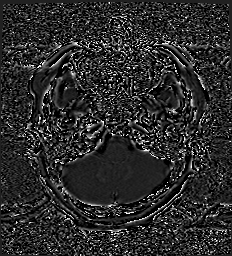
[im 30/60]
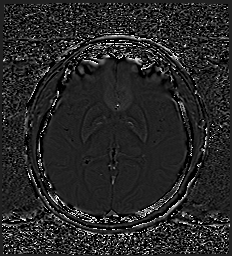
[im 45/60]
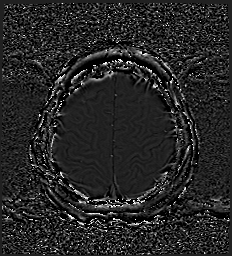
[im 60/60]
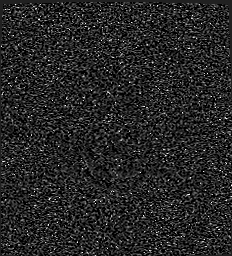

[Series 13: swi_images · axial · 3.0mm · 0.90mm/px · z∈[-102,+75]mm · 5 of 60 slices shown]
[im 1/60]
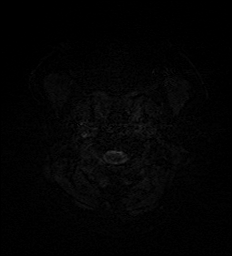
[im 15/60]
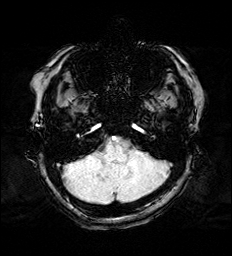
[im 30/60]
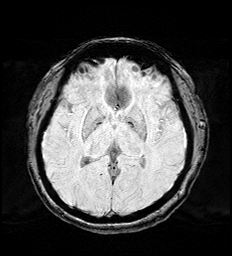
[im 45/60]
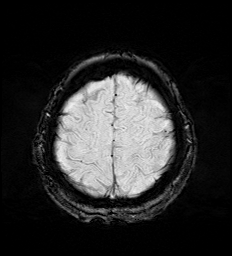
[im 60/60]
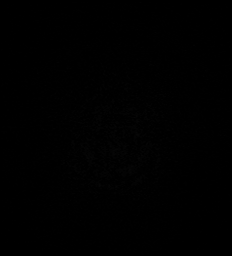

[Series 15: FLAIR · axial · 3.0mm · 0.53mm/px · z∈[-95,+67]mm · 4 of 55 slices shown]
[im 1/55]
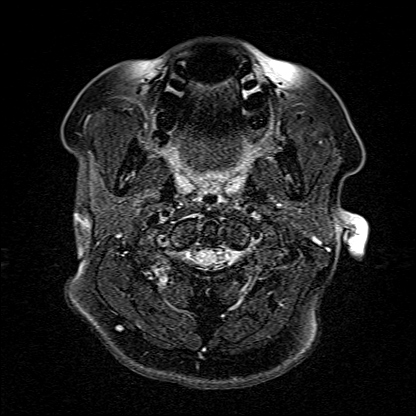
[im 19/55]
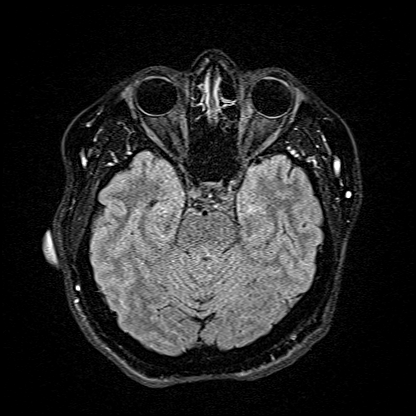
[im 37/55]
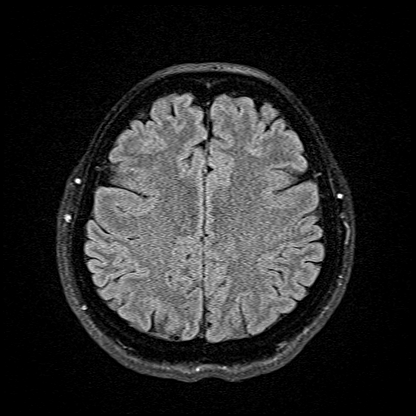
[im 55/55]
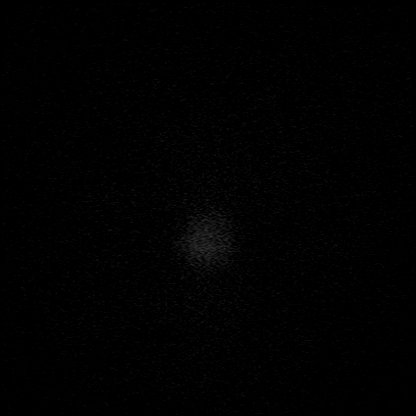

[Series 16: T1 · axial · 1.0mm · 0.98mm/px · z∈[-101,+74]mm · 12 of 173 slices shown (2 of 2)]
[im 1/173]
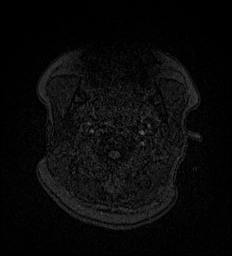
[im 14/173]
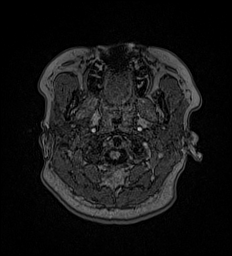
[im 27/173]
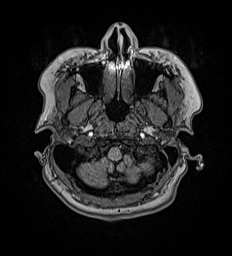
[im 40/173]
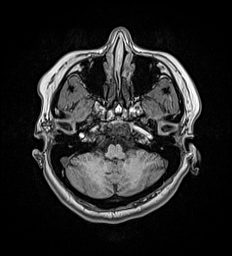
[im 53/173]
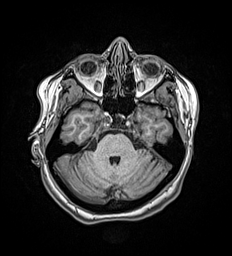
[im 67/173]
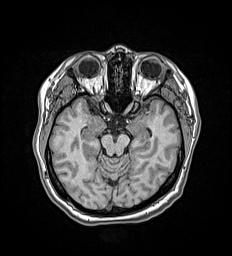
[im 80/173]
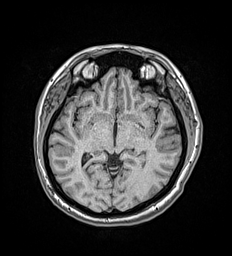
[im 93/173]
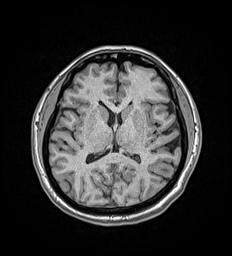
[im 106/173]
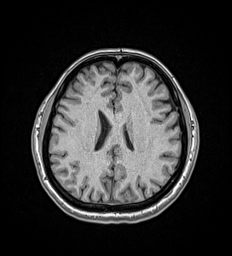
[im 120/173]
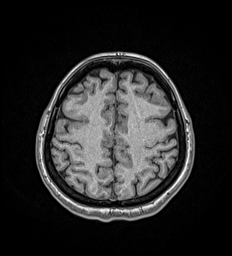
[im 146/173]
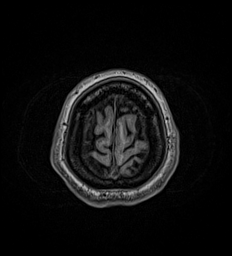
[im 173/173]
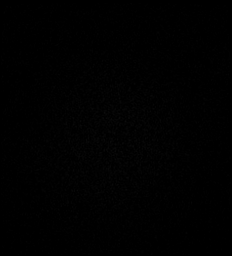

[Series 17: T2 · coronal · 5.0mm · 0.57mm/px · 2 of 29 slices shown (2 of 2)]
[im 1/29]
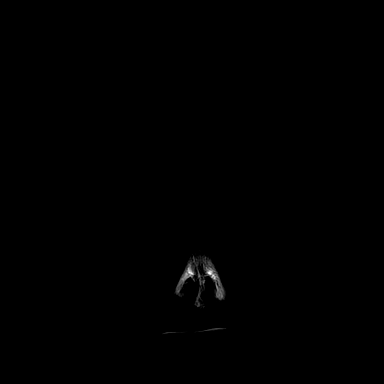
[im 29/29]
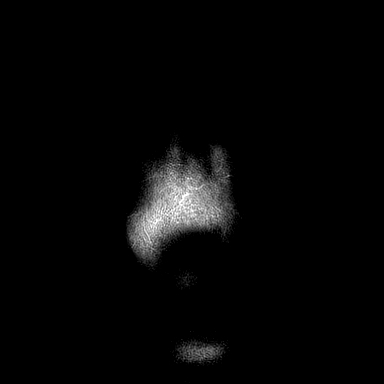

[46 of 48 positions shown; findings below may reference images not displayed]

FINDINGS: MRI HEAD FINDINGS

Brain: No acute infarct, mass effect or extra-axial collection. No
acute or chronic hemorrhage. There is multifocal hyperintense
T2-weighted signal within the white matter. Parenchymal volume and
CSF spaces are normal. The midline structures are normal.

Vascular: Major flow voids are preserved.

Skull and upper cervical spine: Normal calvarium and skull base.
Visualized upper cervical spine and soft tissues are normal.

Sinuses/Orbits:No paranasal sinus fluid levels or advanced mucosal
thickening. No mastoid or middle ear effusion. Normal orbits.

MRA HEAD FINDINGS

POSTERIOR CIRCULATION:

--Vertebral arteries: Normal

--Inferior cerebellar arteries: Normal.

--Basilar artery: Normal.

--Superior cerebellar arteries: Normal.

--Posterior cerebral arteries: Normal.

ANTERIOR CIRCULATION:

--Intracranial internal carotid arteries: Normal.

--Anterior cerebral arteries (ACA): Normal.

--Middle cerebral arteries (MCA): Normal.

ANATOMIC VARIANTS: None
IMPRESSION: 1. No acute intracranial abnormality.
2. Normal intracranial MRA.
3. Multifocal white matter disease, most commonly due to chronic
small vessel disease.

## 2023-11-04 ENCOUNTER — Emergency Department: Payer: Self-pay

## 2023-11-04 ENCOUNTER — Other Ambulatory Visit: Payer: Self-pay

## 2023-11-04 ENCOUNTER — Encounter: Payer: Self-pay | Admitting: Emergency Medicine

## 2023-11-04 ENCOUNTER — Emergency Department
Admission: EM | Admit: 2023-11-04 | Discharge: 2023-11-04 | Disposition: A | Discharge status: Home / Self Care | Payer: Self-pay | Attending: Emergency Medicine | Admitting: Emergency Medicine

## 2023-11-04 DIAGNOSIS — N390 Urinary tract infection, site not specified: Secondary | ICD-10-CM | POA: Insufficient documentation

## 2023-11-04 LAB — URINALYSIS, ROUTINE W REFLEX MICROSCOPIC

## 2023-11-04 LAB — CBC
HCT: 38.2 % (ref 36.0–46.0)
Hemoglobin: 12.9 g/dL (ref 12.0–15.0)
MCH: 30.9 pg (ref 26.0–34.0)
MCHC: 33.8 g/dL (ref 30.0–36.0)
MCV: 91.6 fL (ref 80.0–100.0)
Platelets: 327 10*3/uL (ref 150–400)
RBC: 4.17 MIL/uL (ref 3.87–5.11)
RDW: 13 % (ref 11.5–15.5)
WBC: 12.8 10*3/uL — ABNORMAL HIGH (ref 4.0–10.5)
nRBC: 0 % (ref 0.0–0.2)

## 2023-11-04 LAB — COMPREHENSIVE METABOLIC PANEL
ALT: 50 U/L — ABNORMAL HIGH (ref 0–44)
AST: 29 U/L (ref 15–41)
Albumin: 4.3 g/dL (ref 3.5–5.0)
Alkaline Phosphatase: 69 U/L (ref 38–126)
Anion gap: 10 (ref 5–15)
BUN: 10 mg/dL (ref 6–20)
CO2: 21 mmol/L — ABNORMAL LOW (ref 22–32)
Calcium: 8.9 mg/dL (ref 8.9–10.3)
Chloride: 105 mmol/L (ref 98–111)
Creatinine, Ser: 0.48 mg/dL (ref 0.44–1.00)
GFR, Estimated: >60 mL/min (ref >60)
Glucose, Bld: 124 mg/dL — ABNORMAL HIGH (ref 70–99)
Potassium: 3.9 mmol/L (ref 3.5–5.1)
Sodium: 136 mmol/L (ref 135–145)
Total Bilirubin: 0.9 mg/dL (ref 0.0–1.2)
Total Protein: 7.3 g/dL (ref 6.5–8.1)

## 2023-11-04 LAB — LIPASE, BLOOD: Lipase: 28 U/L (ref 11–51)

## 2023-11-04 LAB — PREGNANCY, URINE: Preg Test, Ur: NEGATIVE

## 2023-11-04 MED ORDER — KETOROLAC TROMETHAMINE 30 MG/ML IJ SOLN
30.0000 mg | Freq: Once | INTRAMUSCULAR | Status: AC
  Administered 2023-11-04: 30 mg via INTRAMUSCULAR
  Filled 2023-11-04: qty 1

## 2023-11-04 MED ORDER — CEPHALEXIN 500 MG PO CAPS
500.0000 mg | ORAL_CAPSULE | Freq: Once | ORAL | Status: AC
  Administered 2023-11-04: 500 mg via ORAL
  Filled 2023-11-04: qty 1

## 2023-11-04 MED ORDER — CEPHALEXIN 500 MG PO CAPS: 500.0000 mg | ORAL_CAPSULE | Freq: Three times a day (TID) | ORAL | 0 refills | Status: AC

## 2023-11-04 MED ORDER — ONDANSETRON 4 MG PO TBDP
4.0000 mg | ORAL_TABLET | Freq: Once | ORAL | Status: AC
  Administered 2023-11-04: 4 mg via ORAL
  Filled 2023-11-04: qty 1

## 2023-11-04 NOTE — ED Provider Notes (Signed)
[facility] Provider Note    Event Date/Time   First MD Initiated Contact with Patient 11/04/23 [phone]     (approximate)   History   Back Pain and Abdominal Pain   HPI  Katelyn Oneal is a 48 y.o. 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  No history of kidney stones reported.  Some urinary discomfort.  No fevers reported     Physical Exam   Triage Vital Signs: ED Triage Vitals  Encounter Vitals Group     BP 11/04/23 0903 134/73     Systolic BP Percentile --      Diastolic BP Percentile --      Pulse Rate 11/04/23 0903 63     Resp 11/04/23 0903 16     Temp 11/04/23 0903 98.1 F (36.7 C)     Temp Source 11/04/23 0903 Oral     SpO2 11/04/23 0903 97 %     Weight 11/04/23 0906 97.5 kg (215 lb)     Height 11/04/23 0906 1.6 m (5\' 3" )     Head Circumference --      Peak Flow --      Pain Score 11/04/23 0906 9     Pain Loc --      Pain Education --      Exclude from Growth Chart --     Most recent vital signs: Vitals:   11/04/23 1025 11/04/23 1317  BP: 126/78 126/65  Pulse: 74 65  Resp: 15 14  Temp:  98.5 F (36.9 C)  SpO2: 97% 95%     General: Awake, no distress.  CV:  Good peripheral perfusion.  Resp:  Normal effort.  Abd:  No distention.  No CVA tenderness, reassuring abdominal exam Other:     ED Results / Procedures / Treatments   Labs (all labs ordered are listed, but only abnormal results are displayed) Labs Reviewed  COMPREHENSIVE METABOLIC PANEL - Abnormal; Notable for the following components:      Result Value   CO2 21 (*)    Glucose, Bld 124 (*)    ALT 50 (*)    All other components within normal limits  CBC - Abnormal; Notable for the following components:   WBC 12.8 (*)    All other components within normal limits  URINALYSIS, ROUTINE W REFLEX MICROSCOPIC - Abnormal; Notable for the following components:   Color,  Urine AMBER (*)    APPearance HAZY (*)    Glucose, UA   (*)    Value: TEST NOT REPORTED DUE TO COLOR INTERFERENCE OF URINE PIGMENT   Hgb urine dipstick   (*)    Value: TEST NOT REPORTED DUE TO COLOR INTERFERENCE OF URINE PIGMENT   Bilirubin Urine   (*)    Value: TEST NOT REPORTED DUE TO COLOR INTERFERENCE OF URINE PIGMENT   Ketones, ur   (*)    Value: TEST NOT REPORTED DUE TO COLOR INTERFERENCE OF URINE PIGMENT   Protein, ur   (*)    Value: TEST NOT REPORTED DUE TO COLOR INTERFERENCE OF URINE PIGMENT   Nitrite   (*)    Value: TEST NOT REPORTED DUE TO COLOR INTERFERENCE OF URINE PIGMENT   Leukocytes,Ua   (*)    Value: TEST NOT REPORTED DUE TO COLOR INTERFERENCE OF URINE PIGMENT   Bacteria, UA RARE (*)    All other components within normal  limits  LIPASE, BLOOD  PREGNANCY, URINE     EKG     RADIOLOGY CT renal stone study viewed interpret by me, no ureterolithiasis noted, confirmed by radiology    PROCEDURES:  Critical Care performed:   Procedures   MEDICATIONS ORDERED IN ED: Medications  ketorolac (TORADOL) 30 MG/ML injection 30 mg (30 mg Intramuscular Given 11/04/23 1025)  ondansetron (ZOFRAN-ODT) disintegrating tablet 4 mg (4 mg Oral Given 11/04/23 1025)  cephALEXin (KEFLEX) capsule 500 mg (500 mg Oral Given 11/04/23 1316)     IMPRESSION / MDM / ASSESSMENT AND PLAN / ED COURSE  I reviewed the triage vital signs and the nursing notes. Patient's presentation is most consistent with acute presentation with potential threat to life or bodily function.  Patient presents with left-sided abdominal discomfort as above, suspicious for ureterolithiasis versus UTI versus diverticulitis  Will treat with IM Toradol, ODT Zofran, obtain CT renal stone study, lab work notable for mildly elevated white blood cell count, urinalysis pending  Patient feeling better after treatment, CT renal stone study negative for ureterolithiasis, suspect UTI is the cause of her discomfort, will treat  with p.o. Keflex, appropriate for outpatient management, return precautions discussed, she agrees with this plan.     FINAL CLINICAL IMPRESSION(S) / ED DIAGNOSES   Final diagnoses:  Lower urinary tract infectious disease     Rx / DC Orders   ED Discharge Orders          Ordered    cephALEXin (KEFLEX) 500 MG capsule  3 times daily        11/04/23 1302             Note:  This document was prepared using Dragon voice recognition software and may include unintentional dictation errors.   Jene Every, MD 11/04/23 1357

## 2023-11-04 NOTE — ED Notes (Addendum)
Patient assisted to the commode for urine sample. Patient reports she has been taking AZO due to have pain with urination.States the pain has moved to her left flank.

## 2023-11-04 NOTE — ED Triage Notes (Signed)
Pt to ED via POV c/o LLQ abdominal pain that radiates into her back. Pt states that she has had her symptoms the past few days. Pt states that she is having some pain with urination. Pt states that she has also had chills.

## 2024-04-11 ENCOUNTER — Telehealth: Payer: Self-pay | Admitting: *Deleted

## 2024-04-22 ENCOUNTER — Other Ambulatory Visit: Payer: Self-pay | Admitting: Primary Care

## 2024-04-22 DIAGNOSIS — Z1231 Encounter for screening mammogram for malignant neoplasm of breast: Secondary | ICD-10-CM

## 2024-04-24 ENCOUNTER — Ambulatory Visit
Admission: RE | Admit: 2024-04-24 | Discharge: 2024-04-24 | Disposition: A | Discharge status: Home / Self Care | Payer: Self-pay | Source: Ambulatory Visit | Attending: Primary Care | Admitting: Primary Care

## 2024-04-24 DIAGNOSIS — Z1231 Encounter for screening mammogram for malignant neoplasm of breast: Secondary | ICD-10-CM | POA: Insufficient documentation

## 2024-04-29 ENCOUNTER — Encounter: Payer: Self-pay | Admitting: Primary Care

## 2024-04-29 ENCOUNTER — Other Ambulatory Visit: Payer: Self-pay | Admitting: Obstetrics and Gynecology

## 2024-04-29 ENCOUNTER — Telehealth: Payer: Self-pay | Admitting: *Deleted

## 2024-04-29 DIAGNOSIS — R928 Other abnormal and inconclusive findings on diagnostic imaging of breast: Secondary | ICD-10-CM

## 2024-05-06 ENCOUNTER — Ambulatory Visit: Payer: Self-pay | Attending: Obstetrics and Gynecology | Admitting: *Deleted

## 2024-05-06 ENCOUNTER — Ambulatory Visit
Admission: RE | Admit: 2024-05-06 | Discharge: 2024-05-06 | Disposition: A | Discharge status: Home / Self Care | Payer: Self-pay | Source: Ambulatory Visit | Attending: Obstetrics and Gynecology | Admitting: Obstetrics and Gynecology

## 2024-05-06 VITALS — BP 126/74 | Wt 221.6 lb

## 2024-05-06 DIAGNOSIS — R928 Other abnormal and inconclusive findings on diagnostic imaging of breast: Secondary | ICD-10-CM | POA: Insufficient documentation

## 2024-05-06 DIAGNOSIS — Z1239 Encounter for other screening for malignant neoplasm of breast: Secondary | ICD-10-CM

## 2024-05-06 NOTE — Progress Notes (Signed)
 Katelyn Oneal is a 48 y.o. female who presents to [facility] clinic today with no complaints. Patient referred to [facility] due to having a screening mammogram completed 04/24/2024 that additional imaging of the left breat is recommended for follow up.   Pap Smear: Pap smear not completed today. Last Pap smear was 03/26/2024 at [facility] clinic and was normal with negative HPV. Per patient has no history of an abnormal Pap smear. Last Pap smear result is available in Epic.   Physical exam: Breasts Breasts symmetrical. No skin abnormalities bilateral breasts. Nipple retraction bilateral breasts that is greater within the right nipple that per patient is normal for her. No nipple discharge bilateral breasts. No lymphadenopathy. No lumps palpated bilateral breasts. No complaints of pain or tenderness on exam.     MS 3D SCR MAMMO BILAT BR (aka MM) Result Date: 04/29/2024 CLINICAL DATA:  Screening. EXAM: DIGITAL SCREENING BILATERAL MAMMOGRAM WITH TOMOSYNTHESIS AND CAD TECHNIQUE: Bilateral screening digital craniocaudal and mediolateral oblique mammograms were obtained. Bilateral screening digital breast tomosynthesis was performed. The images were evaluated with computer-aided detection. COMPARISON:  None available. ACR Breast Density Category d: The breasts are extremely dense, which lowers the sensitivity of mammography. FINDINGS: In the left breast, possible distortion warrants further evaluation. In the right breast, no findings suspicious for malignancy. IMPRESSION: Further evaluation is suggested for possible distortion in the left breast. RECOMMENDATION: Diagnostic mammogram and possibly ultrasound of the left breast. (Code:[id number]) The patient will be contacted regarding the findings, and additional imaging will be scheduled. BI-RADS CATEGORY  0: Incomplete: Need additional imaging evaluation. Electronically Signed   By: Dina  Arceo M.D.   On: 04/29/2024 05:03    Pelvic/Bimanual Pap is not  indicated today per [facility] guidelines.   Smoking History: Patient has never smoked.   Patient Navigation: Patient education provided. Access to services provided for patient through [employer] program. Spanish interpreter Reine Norlander from [facility] provided.   Colorectal Cancer Screening: Per patient has never had colonoscopy completed. Patient stated she completed a FIT test given by her PCP in July 2025 that was negative. No complaints today.    Breast and Cervical Cancer Risk Assessment: Patient does not have family history of breast cancer, known genetic mutations, or radiation treatment to the chest before age 26. Patient does not have history of cervical dysplasia, immunocompromised, or DES exposure in-utero.  Risk Scores as of Encounter on 05/06/2024     Katelyn Oneal           5-year 0.65%   Lifetime 7.67%   This patient is [place] but has no documented birth country, so the [place] model used data from [place] patients to calculate their risk score. Document a birth country in the Demographics activity for a more accurate score.         Last calculated by Driscilla Wanda SQUIBB, RN on 05/06/2024 at  8:59 AM       A: [facility] exam without pap smear No complaints.  P: Referred patient to the [facility] for a left breast diagnostic mammogram per recommendation. Appointment scheduled Monday, May 06, 2024 at 1000.  Driscilla Wanda SQUIBB, RN 05/06/2024 8:58 AM

## 2024-05-06 NOTE — Patient Instructions (Signed)
 Explained breast self awareness with Katelyn Oneal. Patient did not need a Pap smear today due to last Pap smear and HPV typing was 03/26/2024. Let her know [facility] will cover Pap smears and HPV typing every 5 years unless has a history of abnormal Pap smears. Referred patient to the [facility] for a left breast diagnostic mammogram per recommendation. Appointment scheduled Monday, May 06, 2024 at 1000. Patient aware of appointment and will be there. Katelyn Oneal verbalized understanding.  [name], Wanda Ship, RN 8:58 AM
